# Patient Record
Sex: Male | Born: 1965
Health system: Southern US, Community
[De-identification: ages and names within clinical notes are randomized; demographics above are authoritative.]

## PROBLEM LIST (undated history)

## (undated) DIAGNOSIS — F419 Anxiety disorder, unspecified: Secondary | ICD-10-CM

## (undated) DIAGNOSIS — M549 Dorsalgia, unspecified: Secondary | ICD-10-CM

## (undated) DIAGNOSIS — K219 Gastro-esophageal reflux disease without esophagitis: Secondary | ICD-10-CM

## (undated) DIAGNOSIS — F329 Major depressive disorder, single episode, unspecified: Secondary | ICD-10-CM

## (undated) DIAGNOSIS — Z550 Illiteracy and low-level literacy: Secondary | ICD-10-CM

## (undated) DIAGNOSIS — F1910 Other psychoactive substance abuse, uncomplicated: Secondary | ICD-10-CM

## (undated) DIAGNOSIS — I509 Heart failure, unspecified: Secondary | ICD-10-CM

## (undated) DIAGNOSIS — F319 Bipolar disorder, unspecified: Secondary | ICD-10-CM

## (undated) DIAGNOSIS — I639 Cerebral infarction, unspecified: Secondary | ICD-10-CM

## (undated) DIAGNOSIS — F32A Depression, unspecified: Secondary | ICD-10-CM

## (undated) DIAGNOSIS — F988 Other specified behavioral and emotional disorders with onset usually occurring in childhood and adolescence: Secondary | ICD-10-CM

## (undated) DIAGNOSIS — Q249 Congenital malformation of heart, unspecified: Secondary | ICD-10-CM

## (undated) DIAGNOSIS — I10 Essential (primary) hypertension: Secondary | ICD-10-CM

## (undated) DIAGNOSIS — E785 Hyperlipidemia, unspecified: Secondary | ICD-10-CM

## (undated) HISTORY — DX: Other psychoactive substance abuse, uncomplicated: F19.10

## (undated) HISTORY — DX: Hyperlipidemia, unspecified: E78.5

## (undated) HISTORY — DX: Dorsalgia, unspecified: M54.9

## (undated) HISTORY — DX: Cerebral infarction, unspecified: I63.9

## (undated) HISTORY — DX: Illiteracy and low-level literacy: Z55.0

## (undated) HISTORY — DX: Bipolar disorder, unspecified: F31.9

## (undated) HISTORY — DX: Congenital malformation of heart, unspecified: Q24.9

## (undated) HISTORY — PX: CARDIAC CATHETERIZATION: SHX172

## (undated) HISTORY — PX: CARPAL TUNNEL RELEASE: SHX101

## (undated) HISTORY — DX: Major depressive disorder, single episode, unspecified: F32.9

## (undated) HISTORY — DX: Depression, unspecified: F32.A

## (undated) HISTORY — PX: SKIN GRAFT: SHX250

---

## 1994-07-05 HISTORY — PX: BACK SURGERY: SHX140

## 1998-06-15 ENCOUNTER — Emergency Department (HOSPITAL_COMMUNITY): Admission: EM | Admit: 1998-06-15 | Discharge: 1998-06-15 | Payer: Self-pay | Admitting: Emergency Medicine

## 2000-01-04 ENCOUNTER — Emergency Department (HOSPITAL_COMMUNITY): Admission: EM | Admit: 2000-01-04 | Discharge: 2000-01-04 | Payer: Self-pay

## 2000-03-16 ENCOUNTER — Encounter: Admission: RE | Admit: 2000-03-16 | Discharge: 2000-05-03 | Payer: Self-pay | Admitting: Occupational Medicine

## 2000-05-13 ENCOUNTER — Encounter: Admission: RE | Admit: 2000-05-13 | Discharge: 2000-06-13 | Payer: Self-pay | Admitting: Occupational Medicine

## 2005-04-23 ENCOUNTER — Emergency Department (HOSPITAL_COMMUNITY): Admission: EM | Admit: 2005-04-23 | Discharge: 2005-04-24 | Payer: Self-pay | Admitting: Emergency Medicine

## 2005-04-24 ENCOUNTER — Inpatient Hospital Stay (HOSPITAL_COMMUNITY): Admission: AD | Admit: 2005-04-24 | Discharge: 2005-04-26 | Payer: Self-pay | Admitting: Psychiatry

## 2005-04-24 ENCOUNTER — Ambulatory Visit: Payer: Self-pay | Admitting: Psychiatry

## 2006-03-11 ENCOUNTER — Emergency Department (HOSPITAL_COMMUNITY): Admission: EM | Admit: 2006-03-11 | Discharge: 2006-03-11 | Payer: Self-pay | Admitting: Emergency Medicine

## 2006-09-16 ENCOUNTER — Emergency Department (HOSPITAL_COMMUNITY): Admission: AC | Admit: 2006-09-16 | Discharge: 2006-09-16 | Payer: Self-pay

## 2007-04-18 ENCOUNTER — Inpatient Hospital Stay (HOSPITAL_COMMUNITY): Admission: AD | Admit: 2007-04-18 | Discharge: 2007-04-24 | Payer: Self-pay | Admitting: Psychiatry

## 2007-04-18 ENCOUNTER — Emergency Department (HOSPITAL_COMMUNITY): Admission: EM | Admit: 2007-04-18 | Discharge: 2007-04-18 | Payer: Self-pay | Admitting: Emergency Medicine

## 2007-04-18 ENCOUNTER — Ambulatory Visit: Payer: Self-pay | Admitting: Psychiatry

## 2009-11-27 ENCOUNTER — Emergency Department (HOSPITAL_COMMUNITY): Admission: EM | Admit: 2009-11-27 | Discharge: 2009-11-28 | Payer: Self-pay | Admitting: Emergency Medicine

## 2010-05-05 ENCOUNTER — Ambulatory Visit (HOSPITAL_BASED_OUTPATIENT_CLINIC_OR_DEPARTMENT_OTHER): Admission: RE | Admit: 2010-05-05 | Discharge: 2010-05-05 | Payer: Self-pay | Admitting: Orthopedic Surgery

## 2010-05-13 ENCOUNTER — Encounter: Admission: RE | Admit: 2010-05-13 | Discharge: 2010-05-13 | Payer: Self-pay | Admitting: Orthopedic Surgery

## 2010-08-19 ENCOUNTER — Encounter (HOSPITAL_BASED_OUTPATIENT_CLINIC_OR_DEPARTMENT_OTHER)
Admission: RE | Admit: 2010-08-19 | Discharge: 2010-08-19 | Disposition: A | Discharge status: Home / Self Care | Payer: BC Managed Care – PPO | Source: Ambulatory Visit | Attending: Orthopedic Surgery | Admitting: Orthopedic Surgery

## 2010-08-19 DIAGNOSIS — Z01810 Encounter for preprocedural cardiovascular examination: Secondary | ICD-10-CM | POA: Insufficient documentation

## 2010-08-21 ENCOUNTER — Ambulatory Visit (HOSPITAL_BASED_OUTPATIENT_CLINIC_OR_DEPARTMENT_OTHER)
Admission: RE | Admit: 2010-08-21 | Discharge: 2010-08-21 | Disposition: A | Discharge status: Home / Self Care | Payer: BC Managed Care – PPO | Source: Ambulatory Visit | Attending: Orthopedic Surgery | Admitting: Orthopedic Surgery

## 2010-08-21 DIAGNOSIS — Z01812 Encounter for preprocedural laboratory examination: Secondary | ICD-10-CM | POA: Insufficient documentation

## 2010-08-21 DIAGNOSIS — G5600 Carpal tunnel syndrome, unspecified upper limb: Secondary | ICD-10-CM | POA: Insufficient documentation

## 2010-08-21 LAB — POCT HEMOGLOBIN-HEMACUE: Hemoglobin: 15.7 g/dL (ref 13.0–17.0)

## 2010-08-24 NOTE — Op Note (Signed)
  [name], [name]               ACCOUNT NO.:  [id number]  MEDICAL RECORD NO.:  [id number]            PATIENT TYPE:  LOCATION:                                 FACILITY:  PHYSICIAN:  Cindee Salt, M.D.            DATE OF BIRTH:  DATE OF PROCEDURE:  08/21/2010 DATE OF DISCHARGE:                              OPERATIVE REPORT   PREOPERATIVE DIAGNOSIS:  Carpal tunnel syndrome, right hand.  POSTOPERATIVE DIAGNOSIS:  Carpal tunnel syndrome, right hand.  OPERATION:  Decompression, right median nerve.  SURGEON:  Cindee Salt, M.D.  ANESTHESIA:  General with local infiltration.  ANESTHESIOLOGIST:  Bedelia Person, M.D.  HISTORY:  The patient is a 45 year old male with a history of bilateral carpal tunnel syndrome, EMG nerve conduction is positive.  This has not responded to conservative treatment.  He has undergone carpal tunnel release on his left.  Is admitted now for carpal tunnel release on his right side.  He is aware of risks and complications including infection, recurrence of injury to arteries, nerves, tendons; incomplete relief of symptoms and dystrophy.  In the preoperative area, the patient is seen. The extremity marked by both patient and surgeon.  Antibiotic given.  PROCEDURE IN DETAIL:  The patient is brought to the operating room where a general anesthetic was carried out without difficulty.  He was prepped using ChloraPrep, supine position, right arm free.  A 3-minute dry time was allowed.  Time-out taken, confirming patient and procedure.  A longitudinal incision was made in the skin after exsanguination of the limb with an Esmarch bandage and inflation of tourniquet to 250 mmHg. Bleeders were electrocauterized.  The dissection was carried down, splitting in the palmar fascia.  The superficial palmar arch was identified.  Flexor tendon of the ring and little finger identified to the ulnar side of the median nerve.  Carpal retinaculum was incised with sharp dissection.   Right angle and Sewell retractors were placed between skin and forearm fascia.  The fascia released for approximately a centimeter and a half proximal to the wrist crease under direct vision. Canal was explored.  Area of compression to the nerve was immediately apparent.  No further lesions were identified.  The wound was irrigated. Skin closed with interrupted 5-0 Vicryl Rapide sutures.  Local infiltration with 0.25% Marcaine without epinephrine was given, approximately 7 mL was used.  Sterile compressive dressing, splint to the wrist with the fingers free was applied.  On deflation of the tourniquet, all fingers immediately pinked.  He was taken to the recovery room for observation in satisfactory condition.  He will be discharged home, to return to the [facility] [place] in 1 week, on __________ Benita Stabile.          ______________________________ Cindee Salt, M.D.     GK/MEDQ  D:  08/21/2010  T:  08/22/2010  Job:  [id number]  Electronically Signed by Cindee Salt M.D. on 08/24/2010 02:26:43 PM

## 2010-08-24 NOTE — Op Note (Signed)
[name], [name]               ACCOUNT NO.:  [id number]  MEDICAL RECORD NO.:  [id number]          PATIENT TYPE:  AMB  LOCATION:  DSC                          FACILITY:  [facility]  PHYSICIAN:  Cindee Salt, M.D.       DATE OF BIRTH:  Mar 17, 1966  DATE OF PROCEDURE:  05/05/2010 DATE OF DISCHARGE:                              OPERATIVE REPORT   PREOPERATIVE DIAGNOSIS:  Carpal tunnel syndrome, left hand.  POSTOPERATIVE DIAGNOSIS:  Carpal tunnel syndrome, left hand.  OPERATION:  Decompression of left median nerve.  SURGEON:  Cindee Salt, MD  ASSISTANT:  Betha Loa, MD  ANESTHESIA:  IV regional, local, general.  ANESTHESIOLOGIST:  Quita Skye. Krista Blue, MD  HISTORY:  The patient is a 45 year old male with a history of carpal tunnel syndrome bilaterally, EMG nerve conductions positive.  He has elected to undergo surgical release and that conservative treatment has not afforded him significant relief.  Pre, peri, and postoperative course have been discussed along with risks and complications.  He is aware that there is no guarantee with surgery, possibility of infection, recurrence of injury to arteries, nerves, and tendons, incomplete relief of symptoms, and dystrophy.  In the preoperative area, the patient is seen, the extremity is marked by both the patient and surgeon, and antibiotic is given.  PROCEDURE:  The patient was brought to the operating room where a forearm-based IV regional anesthetic was carried out without difficulty under the direction of Dr. Krista Blue.  He was prepped using ChloraPrep, supine position, left arm free.  A 3-minute dry time was allowed.  A time-out was taken conforming the patient and procedure.  A longitudinal incision was made in the palm.  He still had some feeling and general anesthetic was then given.  The dissection was carried down.  Palmar fascia was split.  Superficial palmar arch was identified.  The flexor tendon to the ring little finger was  identified to the ulnar side of the median nerve.  The carpal retinaculum was incised with sharp dissection. Right angle and Sewall retractor were placed between the skin and forearm fascia.  The fascia released for approximately a centimeter and half proximal to the wrist crease under direct vision.  Bleeders were electrocauterized with bipolar.  The nerve was then dissected from the overlying retinaculum.  The area of compression was immediately apparent.  No further lesions were identified.  The wound was irrigated. The skin was closed with interrupted 5-0 Vicryl Rapide.  A significant hyperemia was present to the nerve with a mild deformity, hourglass in nature at the level of the hamate hook.  A sterile compressive dressing and splint was applied.  The fingers were left free.  On deflation of the tourniquet, all fingers immediately pinked.  He was taken to the recovery room for observation in satisfactory condition.  He will be discharged home to return the [facility] [place] in 1 week on Percocet.          ______________________________ Cindee Salt, M.D.     GK/MEDQ  D:  05/05/2010  T:  05/06/2010  Job:  [id number]  Electronically Signed by Cindee Salt  M.D. on 08/24/2010 12:19:20 PM

## 2010-09-15 LAB — POCT HEMOGLOBIN-HEMACUE: Hemoglobin: 15.6 g/dL (ref 13.0–17.0)

## 2010-09-21 LAB — COMPREHENSIVE METABOLIC PANEL
ALT: 29 U/L (ref 0–53)
AST: 25 U/L (ref 0–37)
Albumin: 4 g/dL (ref 3.5–5.2)
CO2: 24 mEq/L (ref 19–32)
Calcium: 9.3 mg/dL (ref 8.4–10.5)
GFR calc Af Amer: >60 mL/min (ref >60)
GFR calc non Af Amer: >60 mL/min (ref >60)
Sodium: 139 mEq/L (ref 135–145)

## 2010-09-21 LAB — SALICYLATE LEVEL: Salicylate Lvl: <4 mg/dL (ref 2.8–20.0)

## 2010-09-21 LAB — DIFFERENTIAL
Eosinophils Absolute: 0.3 10*3/uL (ref 0.0–0.7)
Eosinophils Relative: 3 % (ref 0–5)
Lymphocytes Relative: 18 % (ref 12–46)
Lymphs Abs: 1.7 10*3/uL (ref 0.7–4.0)
Monocytes Absolute: 0.9 10*3/uL (ref 0.1–1.0)
Monocytes Relative: 9 % (ref 3–12)

## 2010-09-21 LAB — ACETAMINOPHEN LEVEL: Acetaminophen (Tylenol), Serum: <10 ug/mL — ABNORMAL LOW (ref 10–30)

## 2010-09-21 LAB — RAPID URINE DRUG SCREEN, HOSP PERFORMED
Amphetamines: NOT DETECTED
Barbiturates: NOT DETECTED
Benzodiazepines: POSITIVE — AB
Tetrahydrocannabinol: POSITIVE — AB

## 2010-09-21 LAB — CBC
MCHC: 33.8 g/dL (ref 30.0–36.0)
Platelets: 278 10*3/uL (ref 150–400)
RBC: 4.84 MIL/uL (ref 4.22–5.81)
WBC: 9.9 10*3/uL (ref 4.0–10.5)

## 2010-10-28 ENCOUNTER — Other Ambulatory Visit: Payer: Self-pay | Admitting: Occupational Medicine

## 2010-10-28 ENCOUNTER — Ambulatory Visit: Payer: Self-pay

## 2010-10-28 DIAGNOSIS — M549 Dorsalgia, unspecified: Secondary | ICD-10-CM
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
## 2011-04-15 LAB — CBC
HCT: 43.6
MCHC: 34.4
MCV: 87.7
RBC: 4.97

## 2011-04-15 LAB — URINALYSIS, ROUTINE W REFLEX MICROSCOPIC
Bilirubin Urine: NEGATIVE
Ketones, ur: NEGATIVE
Nitrite: NEGATIVE
pH: 8

## 2011-04-15 LAB — COMPREHENSIVE METABOLIC PANEL
AST: 21
BUN: 6
CO2: 28
Calcium: 9.2
Creatinine, Ser: 1
GFR calc Af Amer: >60
GFR calc non Af Amer: >60
Glucose, Bld: 99

## 2011-04-15 LAB — DIFFERENTIAL
Basophils Absolute: 0
Basophils Relative: 0
Eosinophils Relative: 0
Monocytes Absolute: 0.6
Neutro Abs: 4.8

## 2011-04-15 LAB — RAPID URINE DRUG SCREEN, HOSP PERFORMED
Amphetamines: NOT DETECTED
Cocaine: NOT DETECTED
Opiates: NOT DETECTED
Tetrahydrocannabinol: POSITIVE — AB

## 2013-01-08 ENCOUNTER — Encounter (INDEPENDENT_AMBULATORY_CARE_PROVIDER_SITE_OTHER): Payer: Self-pay | Admitting: Surgery

## 2013-01-09 ENCOUNTER — Encounter (INDEPENDENT_AMBULATORY_CARE_PROVIDER_SITE_OTHER): Payer: Self-pay | Admitting: Surgery

## 2013-01-09 ENCOUNTER — Ambulatory Visit (INDEPENDENT_AMBULATORY_CARE_PROVIDER_SITE_OTHER): Payer: Private Health Insurance - Indemnity | Admitting: Surgery

## 2013-01-09 VITALS — BP 112/64 | HR 80 | Temp 98.0°F | Resp 16 | Ht 66.0 in | Wt 199.4 lb

## 2013-01-09 DIAGNOSIS — K429 Umbilical hernia without obstruction or gangrene: Secondary | ICD-10-CM

## 2013-01-09 NOTE — Progress Notes (Signed)
Patient ID: [name], male   DOB: 1965/12/12, 47 y.o.   MRN: [id number]  Chief Complaint  Patient presents with  . New Evaluation    eval umb/ventral hernia    HPI [name] is a 47 y.o. male.   HPI This is a 47 year old gentleman referred by Altamease Oiler Redmon PA for asymptomatic umbilical hernia. He reports that he has had a hernia for some time but is now causing increasing discomfort. He has had some nausea with it. The pain is moderate in intensity and intermittent. He has had no vomiting or other obstructive symptoms. He is moving his bowels well.  Past Medical History  Diagnosis Date  . Depression   . Congenital heart disease   . Back pain   . History of ETOH abuse     Past Surgical History  Procedure Laterality Date  . Cardiac catheterization      at age 78  . Back surgery Bilateral 1996  . Skin graft    . Carpal tunnel release      Family History  Problem Relation Age of Onset  . Cancer Maternal Grandmother   . Cancer Paternal Grandfather     lymphoma    Social History History  Substance Use Topics  . Smoking status: Current Every Day Smoker -- 1.00 packs/day    Types: Cigarettes  . Smokeless tobacco: Never Used  . Alcohol Use: Yes     Comment: 12pack - case/week    Allergies  Allergen Reactions  . Codeine     Current Outpatient Prescriptions  Medication Sig Dispense Refill  . famotidine (PEPCID) 40 MG tablet Take 40 mg by mouth daily.      . traZODone (DESYREL) 50 MG tablet Take 50 mg by mouth at bedtime.       No current facility-administered medications for this visit.    Review of Systems Review of Systems  Constitutional: Negative for fever, chills and unexpected weight change.  HENT: Negative for hearing loss, congestion, sore throat, trouble swallowing and voice change.   Eyes: Negative for visual disturbance.  Respiratory: Negative for cough and wheezing.   Cardiovascular: Negative for chest pain, palpitations and leg swelling.   Gastrointestinal: Positive for nausea and abdominal pain. Negative for vomiting, diarrhea, constipation, blood in stool, abdominal distention, anal bleeding and rectal pain.  Genitourinary: Negative for hematuria and difficulty urinating.  Musculoskeletal: Positive for back pain. Negative for arthralgias.  Skin: Negative for rash and wound.  Neurological: Negative for seizures, syncope, weakness and headaches.  Hematological: Negative for adenopathy. Does not bruise/bleed easily.  Psychiatric/Behavioral: Negative for confusion.    Blood pressure 112/64, pulse 80, temperature 98 F (36.7 C), temperature source Temporal, resp. rate 16, height 5\' 6"  (1.676 m), weight 199 lb 6.4 oz (90.447 kg).  Physical Exam Physical Exam  Constitutional: He is oriented to person, place, and time. He appears well-developed and well-nourished. No distress.  HENT:  Head: Normocephalic and atraumatic.  Right Ear: External ear normal.  Left Ear: External ear normal.  Nose: Nose normal.  Mouth/Throat: Oropharynx is clear and moist.  Eyes: Conjunctivae and EOM are normal. Pupils are equal, round, and reactive to light. Right eye exhibits no discharge. Left eye exhibits no discharge. No scleral icterus.  Neck: Normal range of motion. Neck supple. No tracheal deviation present. No thyromegaly present.  Cardiovascular: Normal rate, regular rhythm, normal heart sounds and intact distal pulses.   No murmur heard. Pulmonary/Chest: Effort normal and breath sounds normal. No respiratory distress. He has  no wheezes.  Abdominal: Soft. Bowel sounds are normal. He exhibits no distension. There is no tenderness. There is no rebound.  He has a moderate size, reducible umbilical hernia. There is no evidence of inguinal hernia  Musculoskeletal: Normal range of motion. He exhibits no edema and no tenderness.  Lymphadenopathy:    He has no cervical adenopathy.  Neurological: He is alert and oriented to person, place, and time.   Skin: Skin is dry. No rash noted. He is not diaphoretic. No erythema.  Psychiatric: His behavior is normal.    Data Reviewed   Assessment    Symptomatic umbilical hernia     Plan    Repair of the hernia with mesh was recommended. I discussed this with him in detail. I discussed the risk of incarceration without repair. I discussed the risks of surgery which includes but is not limited to bleeding, infection, recurrence, et Karie Soda. He understands and wishes to proceed. I also discussed postop recovery. Surgery will be scheduled        Paulina Muchmore A 01/09/2013, 10:50 AM

## 2013-02-22 ENCOUNTER — Emergency Department (HOSPITAL_COMMUNITY)
Admission: EM | Admit: 2013-02-22 | Discharge: 2013-02-23 | Disposition: A | Discharge status: Home / Self Care | Payer: Managed Care, Other (non HMO) | Attending: Emergency Medicine | Admitting: Emergency Medicine

## 2013-02-22 ENCOUNTER — Encounter (HOSPITAL_COMMUNITY): Payer: Self-pay | Admitting: Emergency Medicine

## 2013-02-22 DIAGNOSIS — Z8774 Personal history of (corrected) congenital malformations of heart and circulatory system: Secondary | ICD-10-CM | POA: Insufficient documentation

## 2013-02-22 DIAGNOSIS — R443 Hallucinations, unspecified: Secondary | ICD-10-CM | POA: Insufficient documentation

## 2013-02-22 DIAGNOSIS — F17200 Nicotine dependence, unspecified, uncomplicated: Secondary | ICD-10-CM | POA: Insufficient documentation

## 2013-02-22 DIAGNOSIS — F1010 Alcohol abuse, uncomplicated: Secondary | ICD-10-CM

## 2013-02-22 DIAGNOSIS — F329 Major depressive disorder, single episode, unspecified: Secondary | ICD-10-CM | POA: Insufficient documentation

## 2013-02-22 DIAGNOSIS — Z951 Presence of aortocoronary bypass graft: Secondary | ICD-10-CM | POA: Insufficient documentation

## 2013-02-22 DIAGNOSIS — F3289 Other specified depressive episodes: Secondary | ICD-10-CM | POA: Insufficient documentation

## 2013-02-22 DIAGNOSIS — R45851 Suicidal ideations: Secondary | ICD-10-CM | POA: Insufficient documentation

## 2013-02-22 DIAGNOSIS — Z79899 Other long term (current) drug therapy: Secondary | ICD-10-CM | POA: Insufficient documentation

## 2013-02-22 LAB — CBC
Hemoglobin: 15 g/dL (ref 13.0–17.0)
MCHC: 33.6 g/dL (ref 30.0–36.0)
RDW: 13.9 % (ref 11.5–15.5)
WBC: 7.6 10*3/uL (ref 4.0–10.5)

## 2013-02-22 LAB — COMPREHENSIVE METABOLIC PANEL
ALT: 28 U/L (ref 0–53)
Albumin: 3.7 g/dL (ref 3.5–5.2)
Alkaline Phosphatase: 57 U/L (ref 39–117)
Potassium: 3.8 mEq/L (ref 3.5–5.1)
Sodium: 140 mEq/L (ref 135–145)
Total Protein: 7.2 g/dL (ref 6.0–8.3)

## 2013-02-22 LAB — RAPID URINE DRUG SCREEN, HOSP PERFORMED
Amphetamines: NOT DETECTED
Barbiturates: NOT DETECTED
Benzodiazepines: NOT DETECTED
Tetrahydrocannabinol: NOT DETECTED

## 2013-02-22 MED ORDER — ALUM & MAG HYDROXIDE-SIMETH 200-200-20 MG/5ML PO SUSP: 30.0000 mL | ORAL | Status: DC | PRN

## 2013-02-22 MED ORDER — FOLIC ACID 1 MG PO TABS
1.0000 mg | ORAL_TABLET | Freq: Every day | ORAL | Status: DC
  Administered 2013-02-22: 1 mg via ORAL
  Filled 2013-02-22: qty 1

## 2013-02-22 MED ORDER — ADULT MULTIVITAMIN W/MINERALS CH
1.0000 | ORAL_TABLET | Freq: Every day | ORAL | Status: DC
  Administered 2013-02-22: 1 via ORAL
  Filled 2013-02-22: qty 1

## 2013-02-22 MED ORDER — IBUPROFEN 200 MG PO TABS: 600.0000 mg | ORAL_TABLET | Freq: Three times a day (TID) | ORAL | Status: DC | PRN

## 2013-02-22 MED ORDER — LORAZEPAM 1 MG PO TABS: 1.0000 mg | ORAL_TABLET | Freq: Four times a day (QID) | ORAL | Status: DC | PRN

## 2013-02-22 MED ORDER — THIAMINE HCL 100 MG/ML IJ SOLN: 100.0000 mg | Freq: Every day | INTRAMUSCULAR | Status: DC

## 2013-02-22 MED ORDER — NICOTINE 21 MG/24HR TD PT24: 21.0000 mg | MEDICATED_PATCH | Freq: Every day | TRANSDERMAL | Status: DC

## 2013-02-22 MED ORDER — VITAMIN B-1 100 MG PO TABS
100.0000 mg | ORAL_TABLET | Freq: Every day | ORAL | Status: DC
  Administered 2013-02-22: 100 mg via ORAL
  Filled 2013-02-22: qty 1

## 2013-02-22 MED ORDER — ONDANSETRON HCL 4 MG PO TABS: 4.0000 mg | ORAL_TABLET | Freq: Three times a day (TID) | ORAL | Status: DC | PRN

## 2013-02-22 MED ORDER — LORAZEPAM 2 MG/ML IJ SOLN: 1.0000 mg | Freq: Four times a day (QID) | INTRAMUSCULAR | Status: DC | PRN

## 2013-02-22 MED ORDER — ZOLPIDEM TARTRATE 5 MG PO TABS: 5.0000 mg | ORAL_TABLET | Freq: Every evening | ORAL | Status: DC | PRN

## 2013-02-22 MED ORDER — LORAZEPAM 1 MG PO TABS: 0.0000 mg | ORAL_TABLET | Freq: Four times a day (QID) | ORAL | Status: DC

## 2013-02-22 MED ORDER — LORAZEPAM 1 MG PO TABS: 0.0000 mg | ORAL_TABLET | Freq: Two times a day (BID) | ORAL | Status: DC
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
## 2013-02-22 NOTE — ED Notes (Addendum)
Pt presenting to ed with c/o "I need detox from  alcohol pt states he had a plan to kill himself up until his buddy came and got him today and brought him to the ER" pt denies HI

## 2013-02-22 NOTE — ED Provider Notes (Signed)
CSN: [id number]     Arrival date & time 02/22/13  1532 History    This chart was scribed for Magnus Sinning, PA, working with Gerhard Munch, MD by Blanchard Kelch, ED Scribe. This patient was seen in room [facility] and the patient's care was started at 5:31 PM.     Chief Complaint  Patient presents with  . Medical Clearance    Patient is a 47 y.o. male presenting with alcohol problem.  Alcohol Problem This is a chronic problem. The current episode started more than 1 week ago. The problem occurs constantly. Pertinent negatives include no chest pain, no abdominal pain, no headaches and no shortness of breath. Nothing aggravates the symptoms. Nothing relieves the symptoms.    HPI Comments: [name] is a 47 y.o. male who presents to the Emergency Department needing alcohol detox. Patient drinks 24 cans and a pint of liquor on average per day. Patient states his last drink was right before coming (drinking in parking lot) to ED, about two hours ago. . Patient complains of wanting to drink currently. Patient reports suicidal ideations but states he cannot commit suicide because sibling committed suicide 19 years ago and it would devastate his parents. Patient denies homicidal ideations.  Patient was previously admitted at [facility] and completed a 14 day alcohol detox in the past. Patient was sober for a year after completing detox the last time. Patient is a current every day smoker (1 pack).   Past Medical History  Diagnosis Date  . Depression   . Congenital heart disease   . Back pain   . History of ETOH abuse    Past Surgical History  Procedure Laterality Date  . Cardiac catheterization      at age 73  . Back surgery Bilateral 1996  . Skin graft    . Carpal tunnel release     Family History  Problem Relation Age of Onset  . Cancer Maternal Grandmother   . Cancer Paternal Grandfather     lymphoma   History  Substance Use Topics  . Smoking status:  Current Every Day Smoker -- 1.00 packs/day    Types: Cigarettes  . Smokeless tobacco: Never Used  . Alcohol Use: Yes     Comment: 24 beers daily and 1 pint of liquor daily    Review of Systems  Respiratory: Negative for shortness of breath.   Cardiovascular: Negative for chest pain.  Gastrointestinal: Negative for abdominal pain.  Neurological: Negative for headaches.  Psychiatric/Behavioral: Positive for suicidal ideas and hallucinations. Negative for self-injury.       Denies HI  All other systems reviewed and are negative.    Allergies  Codeine  Home Medications   Current Outpatient Rx  Name  Route  Sig  Dispense  Refill  . diazepam (VALIUM) 10 MG tablet   Oral   Take 10 mg by mouth every 12 (twelve) hours as needed for anxiety.         . divalproex (DEPAKOTE ER) 500 MG 24 hr tablet   Oral   Take 500 mg by mouth 2 (two) times daily.         . famotidine (PEPCID) 40 MG tablet   Oral   Take 40 mg by mouth daily.         . QUEtiapine (SEROQUEL) 50 MG tablet   Oral   Take 50 mg by mouth 2 (two) times daily.         . traZODone (DESYREL)  50 MG tablet   Oral   Take 50-150 mg by mouth at bedtime.           Triage Vitals: BP 131/83  Pulse 103  Temp(Src) 98.4 F (36.9 C) (Oral)  Resp 20  SpO2 99%  Physical Exam  Nursing note and vitals reviewed. Constitutional: He appears well-developed and well-nourished. No distress.  HENT:  Head: Normocephalic and atraumatic.  Neck: Normal range of motion. Neck supple.  Cardiovascular: Normal rate, regular rhythm and normal heart sounds.   Pulmonary/Chest: Effort normal and breath sounds normal. No respiratory distress.  Musculoskeletal: Normal range of motion.  Neurological: He is alert.  Skin: Skin is warm and dry. He is not diaphoretic.  Psychiatric: He has a normal mood and affect. He expresses suicidal ideation. He expresses no homicidal ideation.    ED Course  DIAGNOSTIC STUDIES: Oxygen Saturation is  99% on room air, normal by my interpretation.    COORDINATION OF CARE:  5:35 PM - Ordered CBC, CMP, Ethanol screen, Urine drug screen. Will have bedside sitter with patient. Discussed plan for ongoing treatment and psychiatric evaluation with patient. Patient verbalizes understanding and agrees with treatment plan.   Medications  LORazepam (ATIVAN) tablet 1 mg (not administered)    Or  LORazepam (ATIVAN) injection 1 mg (not administered)  thiamine (VITAMIN B-1) tablet 100 mg (not administered)    Or  thiamine (B-1) injection 100 mg (not administered)  folic acid (FOLVITE) tablet 1 mg (not administered)  multivitamin with minerals tablet 1 tablet (not administered)  LORazepam (ATIVAN) tablet 0-4 mg (not administered)    Followed by  LORazepam (ATIVAN) tablet 0-4 mg (not administered)  ibuprofen (ADVIL,MOTRIN) tablet 600 mg (not administered)  zolpidem (AMBIEN) tablet 5 mg (not administered)  ondansetron (ZOFRAN) tablet 4 mg (not administered)  nicotine (NICODERM CQ - dosed in mg/24 hours) patch 21 mg (not administered)  alum & mag hydroxide-simeth (MAALOX/MYLANTA) 200-200-20 MG/5ML suspension 30 mL (not administered)     Procedures (including critical care time)  Labs Reviewed  CBC  URINE RAPID DRUG SCREEN (HOSP PERFORMED)  COMPREHENSIVE METABOLIC PANEL  ETHANOL   No results found. No diagnosis found.  5:50 PM Discussed with Tresa Endo with ACT team.  MDM  Patient presents today requesting alcohol detox.  He also reports vague suicidal thoughts, but no plan.  No signs of DT's at this time.  CIWA orders have been placed.  Psych holding orders have also been placed.  ACT team has been notified of the patient.  I personally performed the services described in this documentation, which was scribed in my presence. The recorded information has been reviewed and is accurate.   Pascal Lux [place], PA-C 02/22/13 1904

## 2013-02-23 DIAGNOSIS — F1010 Alcohol abuse, uncomplicated: Secondary | ICD-10-CM

## 2013-02-23 NOTE — BH Assessment (Signed)
Donell Sievert, PA accepted to [facility] pending bed availability. Contacted:  [facility]: Per Thayer Ohm, beds available. Faxed clinical information. [facility]: Per Fleet Contras, no beds available.  Harlin Rain Ria Comment, [facility] Triage Specialist

## 2013-02-23 NOTE — ED Provider Notes (Signed)
  Medical screening examination/treatment/procedure(s) were performed by non-physician practitioner and as supervising physician I was immediately available for consultation/collaboration.    Gerhard Munch, MD 02/23/13 [phone]

## 2013-02-23 NOTE — Consult Note (Signed)
[facility] Psychiatry Consult   Reason for Consult:  Evaluation for Alcohol detoxification Referring Physician:  Jeraldine Loots MD  [name] is an 47 y.o. male.  Assessment: AXIS I:  Alcohol Abuse AXIS II:  No diagnosis AXIS III:   Past Medical History  Diagnosis Date  . Depression   . Congenital heart disease   . Back pain   . History of ETOH abuse    AXIS IV:  other psychosocial or environmental problems AXIS V:  21-30 behavior considerably influenced by delusions or hallucinations OR serious impairment in judgment, communication OR inability to function in almost all areas  Plan:  Recommend psychiatric Inpatient admission when medically cleared.  Subjective:   [name] is a 47 y.o. male patient presenting voluntarily seeking alcohol detox. Patient consumed 8-9 beers today prior to presenting to the ED. Patient generally consumes 24 beers and a pint of liquor daily. Patient also endorses a history of DT's but denies siezure disorders. Patient has a hx of depression and is followed by Dr Evelene Croon MD. Patient denies any SI/SA/HI or AVH at this present time. HPI:   HPI Elements:     Past Psychiatric History: Past Medical History  Diagnosis Date  . Depression   . Congenital heart disease   . Back pain   . History of ETOH abuse     reports that he has been smoking Cigarettes.  He has been smoking about 1.00 pack per day. He has never used smokeless tobacco. He reports that  drinks alcohol. He reports that he does not use illicit drugs. Family History  Problem Relation Age of Onset  . Cancer Maternal Grandmother   . Cancer Paternal Grandfather     lymphoma   Family History Substance Abuse: Yes, Describe: Engineer, civil (consulting)) Family Supports: Yes, List: (Wife) Living Arrangements: Spouse/significant other Can pt return to current living arrangement?: Yes   Allergies:   Allergies  Allergen Reactions  . Codeine Nausea And Vomiting    Past Psychiatric History: Diagnosis: MDD,  Alcohol abuse  Hospitalizations:  Previous at [facility]  Outpatient Care:  Dr Evelene Croon MD  Substance Abuse Care:  yes  Self-Mutilation:  no  Suicidal Attempts:  no  Violent Behaviors:  no   Objective: Blood pressure 105/66, pulse 86, temperature 98 F (36.7 C), temperature source Oral, resp. rate 20, SpO2 95.00%.There is no weight on file to calculate BMI. Results for orders placed during the hospital encounter of 02/22/13 (from the past 72 hour(s))  URINE RAPID DRUG SCREEN (HOSP PERFORMED)     Status: None   Collection Time    02/22/13  4:41 PM      Result Value Range   Opiates NONE DETECTED  NONE DETECTED   Cocaine NONE DETECTED  NONE DETECTED   Benzodiazepines NONE DETECTED  NONE DETECTED   Amphetamines NONE DETECTED  NONE DETECTED   Tetrahydrocannabinol NONE DETECTED  NONE DETECTED   Barbiturates NONE DETECTED  NONE DETECTED   Comment:            DRUG SCREEN FOR MEDICAL PURPOSES     ONLY.  IF CONFIRMATION IS NEEDED     FOR ANY PURPOSE, NOTIFY LAB     WITHIN 5 DAYS.                LOWEST DETECTABLE LIMITS     FOR URINE DRUG SCREEN     Drug Class       Cutoff (ng/mL)     Amphetamine      1000  Barbiturate      200     Benzodiazepine   200     Tricyclics       300     Opiates          300     Cocaine          300     THC              50  CBC     Status: None   Collection Time    02/22/13  5:20 PM      Result Value Range   WBC 7.6  4.0 - 10.5 K/uL   RBC 4.97  4.22 - 5.81 MIL/uL   Hemoglobin 15.0  13.0 - 17.0 g/dL   HCT 16.1  09.6 - 04.5 %   MCV 89.9  78.0 - 100.0 fL   MCH 30.2  26.0 - 34.0 pg   MCHC 33.6  30.0 - 36.0 g/dL   RDW 40.9  81.1 - 91.4 %   Platelets 271  150 - 400 K/uL  COMPREHENSIVE METABOLIC PANEL     Status: Abnormal   Collection Time    02/22/13  5:20 PM      Result Value Range   Sodium 140  135 - 145 mEq/L   Potassium 3.8  3.5 - 5.1 mEq/L   Chloride 105  96 - 112 mEq/L   CO2 24  19 - 32 mEq/L   Glucose, Bld 84  70 - 99 mg/dL   BUN 14  6 - 23 mg/dL    Creatinine, Ser 7.82  0.50 - 1.35 mg/dL   Calcium 8.9  8.4 - 95.6 mg/dL   Total Protein 7.2  6.0 - 8.3 g/dL   Albumin 3.7  3.5 - 5.2 g/dL   AST 20  0 - 37 U/L   ALT 28  0 - 53 U/L   Alkaline Phosphatase 57  39 - 117 U/L   Total Bilirubin 0.3  0.3 - 1.2 mg/dL   GFR calc non Af Amer 76 (*) >90 mL/min   GFR calc Af Amer 88 (*) >90 mL/min   Comment: (NOTE)     The eGFR has been calculated using the CKD EPI equation.     This calculation has not been validated in all clinical situations.     eGFR's persistently <90 mL/min signify possible Chronic Kidney     Disease.  ETHANOL     Status: Abnormal   Collection Time    02/22/13  5:20 PM      Result Value Range   Alcohol, Ethyl (B) 175 (*) 0 - 11 mg/dL   Comment:            LOWEST DETECTABLE LIMIT FOR     SERUM ALCOHOL IS 11 mg/dL     FOR MEDICAL PURPOSES ONLY   Labs are reviewed and are pertinent for BAL elevated at 175  Current Facility-Administered Medications  Medication Dose Route Frequency Provider Last Rate Last Dose  . alum & mag hydroxide-simeth (MAALOX/MYLANTA) 200-200-20 MG/5ML suspension 30 mL  30 mL Oral PRN Pascal Lux Wingen, PA-C      . folic acid (FOLVITE) tablet 1 mg  1 mg Oral Daily Pascal Lux Wingen, PA-C   1 mg at 02/22/13 1806  . ibuprofen (ADVIL,MOTRIN) tablet 600 mg  600 mg Oral Q8H PRN Pascal Lux Wingen, PA-C      . LORazepam (ATIVAN) tablet 1 mg  1 mg Oral Q6H PRN Pascal Lux  Wingen, PA-C       Or  . LORazepam (ATIVAN) injection 1 mg  1 mg Intravenous Q6H PRN Pascal Lux Wingen, PA-C      . LORazepam (ATIVAN) tablet 0-4 mg  0-4 mg Oral Q6H Heather Van Wingen, PA-C       Followed by  . [START ON 02/24/2013] LORazepam (ATIVAN) tablet 0-4 mg  0-4 mg Oral Q12H Heather Van Wingen, PA-C      . multivitamin with minerals tablet 1 tablet  1 tablet Oral Daily Magnus Sinning, PA-C   1 tablet at 02/22/13 1806  . nicotine (NICODERM CQ - dosed in mg/24 hours) patch 21 mg  21 mg Transdermal Daily Heather Van Wingen,  PA-C      . ondansetron [facility]) tablet 4 mg  4 mg Oral Q8H PRN Pascal Lux Wingen, PA-C      . thiamine (VITAMIN B-1) tablet 100 mg  100 mg Oral Daily Pascal Lux Wingen, PA-C   100 mg at 02/22/13 1806   Or  . thiamine (B-1) injection 100 mg  100 mg Intravenous Daily Heather Van Wingen, PA-C      . zolpidem (AMBIEN) tablet 5 mg  5 mg Oral QHS PRN Magnus Sinning, PA-C       Current Outpatient Prescriptions  Medication Sig Dispense Refill  . diazepam (VALIUM) 10 MG tablet Take 10 mg by mouth every 12 (twelve) hours as needed for anxiety.      . divalproex (DEPAKOTE ER) 500 MG 24 hr tablet Take 500 mg by mouth 2 (two) times daily.      . famotidine (PEPCID) 40 MG tablet Take 40 mg by mouth daily.      . QUEtiapine (SEROQUEL) 50 MG tablet Take 50 mg by mouth 2 (two) times daily.      . traZODone (DESYREL) 50 MG tablet Take 50-150 mg by mouth at bedtime.         Psychiatric Specialty Exam:     Blood pressure 105/66, pulse 86, temperature 98 F (36.7 C), temperature source Oral, resp. rate 20, SpO2 95.00%.There is no weight on file to calculate BMI.  General Appearance: Disheveled  Eye Contact::  Good  Speech:  Clear and Coherent  Volume:  Normal  Mood:  Worthless  Affect:  Congruent  Thought Process:  Circumstantial  Orientation:  Full (Time, Place, and Person)  Thought Content:  Negative  Suicidal Thoughts:  No  Homicidal Thoughts:  No  Memory:  Immediate;   Good  Judgement:  Fair  Insight:  Fair  Psychomotor Activity:  Negative  Concentration:  Good  Recall:  Good  Akathisia:  Negative  Handed:  Right  AIMS (if indicated):     Assets:  Desire for Improvement  Sleep:      Treatment Plan Summary: 1) Patient meeting IP criteria for crises mgmt, safety and stabilization for Alcohol detox pending bed at [facility] 300 hall 2) Social work to aid in OP support services( AA )and f/u with Dr Evelene Croon upon d/c from [facility] 3) Administration of psychotropics and psychotherapeutic  interventions 4) Mgmt of applicable co-morbid condtions  SIMON,SPENCER E 02/23/2013 3:06 AM  I agreed with the findings, treatment and disposition plan of this patient. Kathryne Sharper, MD

## 2013-02-23 NOTE — B[facility] Counselor (Signed)
Writer consulted with the Dr. Taylor regarding the patient not meeting criteria for inpatient hospitalization.  Writer discussed the recommendations of Dr. Taylor with the ER MD.      Writer met with the patient and he continues to deny SI/HI and psychosis. Writer provided the patient with outpatient referrals.  

## 2013-02-23 NOTE — ED Provider Notes (Signed)
T/C from ACT team: Psych Dr. Ladona Ridgel has evaluated pt and requests EDP to d/c pt, as he no longer wants to go to detox. ACT team to give pt outpatient detox resources for pt to call. Pt d/c.    Laray Anger, DO 02/23/13 1057

## 2013-02-23 NOTE — Progress Notes (Signed)
Mr Beeney says he knows he needs help with his drinking.  He does not want to go to detox and will follow up with his outpatient psychiatrist and AA.  He presents no danger to himself or others , recommend discharge home today.

## 2013-07-24 ENCOUNTER — Ambulatory Visit: Payer: Self-pay

## 2014-06-11 ENCOUNTER — Encounter: Payer: Self-pay | Admitting: Medical

## 2014-06-11 ENCOUNTER — Ambulatory Visit (INDEPENDENT_AMBULATORY_CARE_PROVIDER_SITE_OTHER): Payer: Managed Care, Other (non HMO) | Admitting: Medical

## 2014-06-11 VITALS — BP 122/80 | HR 88 | Temp 97.7°F | Resp 16 | Ht 66.0 in | Wt 229.0 lb

## 2014-06-11 DIAGNOSIS — K645 Perianal venous thrombosis: Secondary | ICD-10-CM

## 2014-06-11 MED ORDER — HYDROCORTISONE 2.5 % RE CREA: 1.0000 "application " | TOPICAL_CREAM | Freq: Two times a day (BID) | RECTAL | Status: DC

## 2014-06-11 MED ORDER — DOCUSATE SODIUM 100 MG PO CAPS: 100.0000 mg | ORAL_CAPSULE | Freq: Two times a day (BID) | ORAL | Status: DC

## 2014-06-11 MED ORDER — HYDROCORTISONE ACETATE 25 MG RE SUPP: 25.0000 mg | Freq: Two times a day (BID) | RECTAL | Status: DC

## 2014-06-11 NOTE — Progress Notes (Signed)
Subjective: Here as a new patient.  He is here for blood in the stool.  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  Last week he was lifting something heavy and later felt a bulging mass at his anus. Since then he has seen some bright red blood on the told paper.  Other than that he occasionally has seen blood in the stool but not regularly, none in quite some time. He does get hemorrhoids from time to time and doing heavy lifting. No prior surgery for hemorrhoids. No prior colonoscopy.  Both his father and wife advised him that he probably has a hemorrhoid given his complaints and he is walking funny.  ROS as in subjective  Objective: Gen.: Well-developed, obese white male no acute distress Abdomen: Positive bowel sounds, soft, nontender, no mass or organomegaly To the right of the anus is a 2 cm thrombosed hemorrhoid that is mildly tender otherwise no other fissure or abnormality at the anus, the hemorrhoid is seeping a little blood   Assessment: Encounter Diagnosis  Name Primary?  . Thrombosed external hemorrhoid Yes    Plan: We discussed his findings, and the fact that this may not resolve without procedure to remove the clotted material.  Nevertheless, he wants to try another approach. Thus, will try SITZ baths, suppository and cream, increase water and fiber intake avoid straining.    Specific recommendations today include:  Tell your wife that she is correct  For the next 7 days, use SITZ baths - hot bath soaks with epsom salts and soapy water to soak your rectum for 20+ minutes daily  Avoid straining and heavy lifting  Begin the suppositories and the topical cream I sent to your pharmacy  If needed, use the Colace stools softener oral capsules  Make sure you are getting 64 oz of water daily, 25 g of fiber daily in your diet  If the hemorrhoid gets bigger or more painful, come back right away   F/u 1wk

## 2014-06-11 NOTE — Patient Instructions (Signed)
Thank you for giving me the opportunity to serve you today.    Your diagnosis today includes: Encounter Diagnosis  Name Primary?  . Thrombosed external hemorrhoid Yes    Specific recommendations today include:  Tell your wife that she is correct  For the next 7 days, use SITZ baths - hot bath soaks with epsom salts and soapy water to soak your rectum for 20+ minutes daily  Avoid straining and heavy lifting  Begin the suppositories and the topical cream I sent to your pharmacy  If needed, use the Colace stools softener oral capsules  Make sure you are getting 64 oz of water daily, 25 g of fiber daily in your diet  If the hemorrhoid gets bigger or more painful, come back right away    I have included other useful information below for your review.  Hemorrhoids Hemorrhoids are swollen veins around the rectum or anus. There are two types of hemorrhoids:   Internal hemorrhoids. These occur in the veins just inside the rectum. They may poke through to the outside and become irritated and painful.  External hemorrhoids. These occur in the veins outside the anus and can be felt as a painful swelling or hard lump near the anus. CAUSES  Pregnancy.   Obesity.   Constipation or diarrhea.   Straining to have a bowel movement.   Sitting for long periods on the toilet.  Heavy lifting or other activity that caused you to strain.  Anal intercourse. SYMPTOMS   Pain.   Anal itching or irritation.   Rectal bleeding.   Fecal leakage.   Anal swelling.   One or more lumps around the anus.  DIAGNOSIS  Your caregiver may be able to diagnose hemorrhoids by visual examination. Other examinations or tests that may be performed include:   Examination of the rectal area with a gloved hand (digital rectal exam).   Examination of anal canal using a small tube (scope).   A blood test if you have lost a significant amount of blood.  A test to look inside the colon  (sigmoidoscopy or colonoscopy). TREATMENT Most hemorrhoids can be treated at home. However, if symptoms do not seem to be getting better or if you have a lot of rectal bleeding, your caregiver may perform a procedure to help make the hemorrhoids get smaller or remove them completely. Possible treatments include:   Placing a rubber band at the base of the hemorrhoid to cut off the circulation (rubber band ligation).   Injecting a chemical to shrink the hemorrhoid (sclerotherapy).   Using a tool to burn the hemorrhoid (infrared light therapy).   Surgically removing the hemorrhoid (hemorrhoidectomy).   Stapling the hemorrhoid to block blood flow to the tissue (hemorrhoid stapling).  HOME CARE INSTRUCTIONS   Eat foods with fiber, such as whole grains, beans, nuts, fruits, and vegetables. Ask your doctor about taking products with added fiber in them (fibersupplements).  Increase fluid intake. Drink enough water and fluids to keep your urine clear or pale yellow.   Exercise regularly.   Go to the bathroom when you have the urge to have a bowel movement. Do not wait.   Avoid straining to have bowel movements.   Keep the anal area dry and clean. Use wet toilet paper or moist towelettes after a bowel movement.   Medicated creams and suppositories may be used or applied as directed.   Only take over-the-counter or prescription medicines as directed by your caregiver.   Take warm sitz baths  for 15-20 minutes, 3-4 times a day to ease pain and discomfort.   Place ice packs on the hemorrhoids if they are tender and swollen. Using ice packs between sitz baths may be helpful.   Put ice in a plastic bag.   Place a towel between your skin and the bag.   Leave the ice on for 15-20 minutes, 3-4 times a day.   Do not use a donut-shaped pillow or sit on the toilet for long periods. This increases blood pooling and pain.  SEEK MEDICAL CARE IF:  You have increasing pain and  swelling that is not controlled by treatment or medicine.  You have uncontrolled bleeding.  You have difficulty or you are unable to have a bowel movement.  You have pain or inflammation outside the area of the hemorrhoids. MAKE SURE YOU:  Understand these instructions.  Will watch your condition.  Will get help right away if you are not doing well or get worse. Document Released: 06/18/2000 Document Revised: 06/07/2012 Document Reviewed: 04/25/2012 [facility] Patient Information 2015 [place], [place]. This information is not intended to replace advice given to you by your health care provider. Make sure you discuss any questions you have with your health care provider.

## 2014-10-29 ENCOUNTER — Emergency Department (HOSPITAL_COMMUNITY): Payer: Managed Care, Other (non HMO)

## 2014-10-29 ENCOUNTER — Encounter (HOSPITAL_COMMUNITY): Payer: Self-pay

## 2014-10-29 ENCOUNTER — Inpatient Hospital Stay (HOSPITAL_COMMUNITY)
Admission: EM | Admit: 2014-10-29 | Discharge: 2014-11-01 | DRG: 066 | Disposition: A | Discharge status: Home Health | Payer: Managed Care, Other (non HMO) | Attending: Family Medicine | Admitting: Family Medicine

## 2014-10-29 DIAGNOSIS — R079 Chest pain, unspecified: Secondary | ICD-10-CM

## 2014-10-29 DIAGNOSIS — E785 Hyperlipidemia, unspecified: Secondary | ICD-10-CM

## 2014-10-29 DIAGNOSIS — F17210 Nicotine dependence, cigarettes, uncomplicated: Secondary | ICD-10-CM | POA: Diagnosis present

## 2014-10-29 DIAGNOSIS — F17200 Nicotine dependence, unspecified, uncomplicated: Secondary | ICD-10-CM

## 2014-10-29 DIAGNOSIS — G4733 Obstructive sleep apnea (adult) (pediatric): Secondary | ICD-10-CM | POA: Diagnosis present

## 2014-10-29 DIAGNOSIS — F329 Major depressive disorder, single episode, unspecified: Secondary | ICD-10-CM | POA: Diagnosis present

## 2014-10-29 DIAGNOSIS — R479 Unspecified speech disturbances: Secondary | ICD-10-CM

## 2014-10-29 DIAGNOSIS — Z6837 Body mass index (BMI) 37.0-37.9, adult: Secondary | ICD-10-CM

## 2014-10-29 DIAGNOSIS — F419 Anxiety disorder, unspecified: Secondary | ICD-10-CM | POA: Diagnosis present

## 2014-10-29 DIAGNOSIS — I63412 Cerebral infarction due to embolism of left middle cerebral artery: Secondary | ICD-10-CM | POA: Diagnosis not present

## 2014-10-29 DIAGNOSIS — I639 Cerebral infarction, unspecified: Secondary | ICD-10-CM | POA: Diagnosis not present

## 2014-10-29 DIAGNOSIS — I10 Essential (primary) hypertension: Secondary | ICD-10-CM

## 2014-10-29 DIAGNOSIS — F1210 Cannabis abuse, uncomplicated: Secondary | ICD-10-CM

## 2014-10-29 DIAGNOSIS — K219 Gastro-esophageal reflux disease without esophagitis: Secondary | ICD-10-CM | POA: Diagnosis present

## 2014-10-29 DIAGNOSIS — F1010 Alcohol abuse, uncomplicated: Secondary | ICD-10-CM | POA: Diagnosis present

## 2014-10-29 DIAGNOSIS — M545 Low back pain: Secondary | ICD-10-CM | POA: Diagnosis present

## 2014-10-29 HISTORY — DX: Heart failure, unspecified: I50.9

## 2014-10-29 HISTORY — DX: Essential (primary) hypertension: I10

## 2014-10-29 LAB — COMPREHENSIVE METABOLIC PANEL
ALT: 36 U/L (ref 0–53)
AST: 20 U/L (ref 0–37)
Albumin: 3.6 g/dL (ref 3.5–5.2)
Alkaline Phosphatase: 47 U/L (ref 39–117)
Anion gap: 10 (ref 5–15)
BILIRUBIN TOTAL: 0.5 mg/dL (ref 0.3–1.2)
BUN: 10 mg/dL (ref 6–23)
CO2: 23 mmol/L (ref 19–32)
CREATININE: 1.02 mg/dL (ref 0.50–1.35)
Calcium: 8.8 mg/dL (ref 8.4–10.5)
Chloride: 104 mmol/L (ref 96–112)
GFR calc Af Amer: >90 mL/min (ref >90)
GFR calc non Af Amer: 84 mL/min — ABNORMAL LOW (ref >90)
GLUCOSE: 81 mg/dL (ref 70–99)
Potassium: 3.9 mmol/L (ref 3.5–5.1)
Sodium: 137 mmol/L (ref 135–145)
Total Protein: 6.3 g/dL (ref 6.0–8.3)

## 2014-10-29 LAB — I-STAT TROPONIN, ED
TROPONIN I, POC: 0.01 ng/mL (ref 0.00–0.08)
Troponin i, poc: 0 ng/mL (ref 0.00–0.08)

## 2014-10-29 LAB — CBC WITH DIFFERENTIAL/PLATELET
BASOS ABS: 0 10*3/uL (ref 0.0–0.1)
BASOS PCT: 0 % (ref 0–1)
Eosinophils Absolute: 0.2 10*3/uL (ref 0.0–0.7)
Eosinophils Relative: 2 % (ref 0–5)
HCT: 39.7 % (ref 39.0–52.0)
Hemoglobin: 13.2 g/dL (ref 13.0–17.0)
LYMPHS ABS: 2.2 10*3/uL (ref 0.7–4.0)
Lymphocytes Relative: 24 % (ref 12–46)
MCH: 29.7 pg (ref 26.0–34.0)
MCHC: 33.2 g/dL (ref 30.0–36.0)
MCV: 89.4 fL (ref 78.0–100.0)
MONOS PCT: 13 % — AB (ref 3–12)
Monocytes Absolute: 1.2 10*3/uL — ABNORMAL HIGH (ref 0.1–1.0)
NEUTROS PCT: 61 % (ref 43–77)
Neutro Abs: 5.6 10*3/uL (ref 1.7–7.7)
Platelets: 236 10*3/uL (ref 150–400)
RBC: 4.44 MIL/uL (ref 4.22–5.81)
RDW: 13.7 % (ref 11.5–15.5)
WBC: 9.3 10*3/uL (ref 4.0–10.5)

## 2014-10-29 LAB — VALPROIC ACID LEVEL: Valproic Acid Lvl: <10 ug/mL — ABNORMAL LOW (ref 50.0–100.0)

## 2014-10-29 LAB — CBG MONITORING, ED: GLUCOSE-CAPILLARY: 125 mg/dL — AB (ref 70–99)

## 2014-10-29 MED ORDER — DIVALPROEX SODIUM ER 500 MG PO TB24
1000.0000 mg | ORAL_TABLET | Freq: Two times a day (BID) | ORAL | Status: DC
  Administered 2014-10-30 – 2014-11-01 (×6): 1000 mg via ORAL
  Filled 2014-10-29 (×9): qty 2

## 2014-10-29 MED ORDER — ASPIRIN 300 MG RE SUPP: 300.0000 mg | Freq: Every day | RECTAL | Status: DC

## 2014-10-29 MED ORDER — LORAZEPAM 2 MG/ML IJ SOLN
1.0000 mg | Freq: Once | INTRAMUSCULAR | Status: DC
  Filled 2014-10-29: qty 1

## 2014-10-29 MED ORDER — BENZTROPINE MESYLATE 0.5 MG PO TABS
1.0000 mg | ORAL_TABLET | Freq: Two times a day (BID) | ORAL | Status: DC
  Administered 2014-10-30 – 2014-10-31 (×5): 1 mg via ORAL
  Filled 2014-10-29 (×5): qty 2

## 2014-10-29 MED ORDER — DIAZEPAM 5 MG PO TABS
5.0000 mg | ORAL_TABLET | Freq: Two times a day (BID) | ORAL | Status: DC | PRN
  Administered 2014-10-30: 5 mg via ORAL
  Filled 2014-10-29: qty 1

## 2014-10-29 MED ORDER — LORAZEPAM 2 MG/ML IJ SOLN
1.0000 mg | Freq: Once | INTRAMUSCULAR | Status: AC
  Administered 2014-10-29: 1 mg via INTRAVENOUS
  Filled 2014-10-29: qty 1

## 2014-10-29 MED ORDER — ASPIRIN 325 MG PO TABS
325.0000 mg | ORAL_TABLET | Freq: Once | ORAL | Status: AC
  Administered 2014-10-29: 325 mg via ORAL
  Filled 2014-10-29: qty 1

## 2014-10-29 MED ORDER — STROKE: EARLY STAGES OF RECOVERY BOOK
Freq: Once | Status: DC
  Filled 2014-10-29: qty 1

## 2014-10-29 MED ORDER — VITAMIN B-1 100 MG PO TABS
100.0000 mg | ORAL_TABLET | Freq: Every day | ORAL | Status: DC
  Administered 2014-10-30 – 2014-11-01 (×3): 100 mg via ORAL
  Filled 2014-10-29 (×2): qty 1

## 2014-10-29 MED ORDER — ADULT MULTIVITAMIN W/MINERALS CH
1.0000 | ORAL_TABLET | Freq: Every day | ORAL | Status: DC
  Administered 2014-10-30 – 2014-11-01 (×3): 1 via ORAL
  Filled 2014-10-29 (×3): qty 1

## 2014-10-29 MED ORDER — ENOXAPARIN SODIUM 40 MG/0.4ML ~~LOC~~ SOLN
40.0000 mg | SUBCUTANEOUS | Status: DC
  Administered 2014-10-30 – 2014-10-31 (×2): 40 mg via SUBCUTANEOUS
  Filled 2014-10-29 (×3): qty 0.4

## 2014-10-29 MED ORDER — ASENAPINE MALEATE 5 MG SL SUBL
5.0000 mg | SUBLINGUAL_TABLET | Freq: Every day | SUBLINGUAL | Status: DC
  Administered 2014-10-31: 5 mg via SUBLINGUAL
  Filled 2014-10-29 (×5): qty 1

## 2014-10-29 MED ORDER — THIAMINE HCL 100 MG/ML IJ SOLN: 100.0000 mg | Freq: Every day | INTRAMUSCULAR | Status: DC

## 2014-10-29 MED ORDER — LORAZEPAM 1 MG PO TABS: 1.0000 mg | ORAL_TABLET | Freq: Four times a day (QID) | ORAL | Status: DC | PRN

## 2014-10-29 MED ORDER — ASPIRIN 325 MG PO TABS
325.0000 mg | ORAL_TABLET | Freq: Every day | ORAL | Status: DC
  Administered 2014-10-30 – 2014-11-01 (×3): 325 mg via ORAL
  Filled 2014-10-29 (×3): qty 1

## 2014-10-29 MED ORDER — FOLIC ACID 1 MG PO TABS
1.0000 mg | ORAL_TABLET | Freq: Every day | ORAL | Status: DC
  Administered 2014-10-30 – 2014-11-01 (×3): 1 mg via ORAL
  Filled 2014-10-29 (×3): qty 1

## 2014-10-29 MED ORDER — LORAZEPAM 2 MG/ML IJ SOLN: 1.0000 mg | Freq: Four times a day (QID) | INTRAMUSCULAR | Status: DC | PRN

## 2014-10-29 NOTE — ED Notes (Signed)
Pt. Took out IV and stated he cant wait anymore he is going to go and to just send him his results. MD notified

## 2014-10-29 NOTE — H&P (Signed)
Hospitalist Admission History and Physical  Patient name: [name] Medical record number: [id number] Date of birth: [birth year] Age: 49 y.o. Gender: male  Primary Care Provider: [name], PA-C  Chief Complaint: CVA  History of Present Illness:This is a 49 y.o. year old male with significant past medical history of depression, ETOH abuse, congenital heart disease presenting with CVA, chest pain. Pt reports stutterting over the past day. States that this is a new issue for him. Denies any hemiparesis, confusion. No hx/o CVA. Drinks heavily on the weekend-18+ beers per day. Had 1 episode of central chest pain earlier today. Some radiation down the L arm. No assd nausea or diaphoresis. Sxs lasted approx 45 minutes and self resolved. Does report GERD sxs recently. Sxs today not assd w/ eating.  Presented to ER afebrile, hemodynamically stable. CBC and CMP within normal limits. EKG normal sinus rhythm. Troponin negative 2. Chest x-ray within normal limits. Head CT with noted focal area of low attenuation in the left subcortical parietal lobe concerning for subacute versus chronic infarct.  Assessment and Plan: [name] is a 49 y.o. year old male presenting with CVA, chest pain   Active Problems:   Embolic stroke   CVA (cerebral infarction)   1- CVA  -proceed down stroke pathway including MRI, MRA, 2D ECHO, carotid dopplers, risk stratification labs -full dose AS -appreciate neuro input-f/u recs  2- Chest Pain  -somewhat mixed sxs  -HEART score 2-3  -trop neg x1, EKG NSR -full dose ASA -cycle CEs -risk stratification labs -tele bed  3- ETOH abuse -check ETOH level  -UDS -CIWA protocol   4-Depression  -mood stable  -cont home regimen  FEN/GI: heart healthy diet  Prophylaxis: lovenox  Disposition: pending further evaluation  Code Status:Full Code    Patient Active Problem List   Diagnosis Date Noted  . Embolic stroke 25/42/7062  . CVA (cerebral  infarction) 10/29/2014  . Umbilical hernia 37/62/8315   Past Medical History: Past Medical History  Diagnosis Date  . Depression   . Congenital heart disease   . Back pain   . History of ETOH abuse     Past Surgical History: Past Surgical History  Procedure Laterality Date  . Cardiac catheterization      at age 29  . Back surgery Bilateral 1996  . Skin graft    . Carpal tunnel release      Social History: History   Social History  . Marital Status: Married    Spouse Name: N/A  . Number of Children: N/A  . Years of Education: N/A   Social History Main Topics  . Smoking status: Current Every Day Smoker -- 1.00 packs/day    Types: Cigarettes  . Smokeless tobacco: Never Used  . Alcohol Use: Yes     Comment: 24 beers daily and 1 pint of liquor daily  . Drug Use: No  . Sexual Activity: Not on file   Other Topics Concern  . None   Social History Narrative    Family History: Family History  Problem Relation Age of Onset  . Cancer Maternal Grandmother   . Cancer Paternal Grandfather     lymphoma    Allergies: Allergies  Allergen Reactions  . Codeine Nausea And Vomiting    Current Facility-Administered Medications  Medication Dose Route Frequency Provider Last Rate Last Dose  .  stroke: mapping our early stages of recovery book   Does not apply Once Deneise Lever, MD      . [  START ON 10/30/2014] asenapine (SAPHRIS) sublingual tablet 5 mg  5 mg Sublingual Daily Deneise Lever, MD      . Derrill Memo ON 10/30/2014] aspirin suppository 300 mg  300 mg Rectal Daily Deneise Lever, MD       Or  . Derrill Memo ON 10/30/2014] aspirin tablet 325 mg  325 mg Oral Daily Deneise Lever, MD      . benztropine (COGENTIN) tablet 1 mg  1 mg Oral BID Deneise Lever, MD      . diazepam (VALIUM) tablet 5 mg  5 mg Oral Q12H PRN Deneise Lever, MD      . divalproex (DEPAKOTE ER) 24 hr tablet 1,000 mg  1,000 mg Oral BID Deneise Lever, MD      . enoxaparin (LOVENOX) injection 40 mg  40  mg Subcutaneous Q24H Deneise Lever, MD      . Derrill Memo ON 1/61/0960] folic acid (FOLVITE) tablet 1 mg  1 mg Oral Daily Deneise Lever, MD      . LORazepam (ATIVAN) injection 1 mg  1 mg Intravenous Once Orlie Dakin, MD   1 mg at 10/29/14 2020  . LORazepam (ATIVAN) tablet 1 mg  1 mg Oral Q6H PRN Deneise Lever, MD       Or  . LORazepam (ATIVAN) injection 1 mg  1 mg Intravenous Q6H PRN Deneise Lever, MD      . Derrill Memo ON 10/30/2014] multivitamin with minerals tablet 1 tablet  1 tablet Oral Daily Deneise Lever, MD      . Derrill Memo ON 10/30/2014] thiamine (VITAMIN B-1) tablet 100 mg  100 mg Oral Daily Deneise Lever, MD       Or  . Derrill Memo ON 10/30/2014] thiamine (B-1) injection 100 mg  100 mg Intravenous Daily Deneise Lever, MD       Current Outpatient Prescriptions  Medication Sig Dispense Refill  . Asenapine Maleate (SAPHRIS SL) Place 1 tablet under the tongue daily.    . benztropine (COGENTIN) 1 MG tablet Take 1 mg by mouth 2 (two) times daily.    . diazepam (VALIUM) 5 MG tablet Take 5 mg by mouth every 12 (twelve) hours as needed for anxiety.    . divalproex (DEPAKOTE ER) 500 MG 24 hr tablet Take 1,000 mg by mouth 2 (two) times daily.     Marland Kitchen docusate sodium (COLACE) 100 MG capsule Take 1 capsule (100 mg total) by mouth 2 (two) times daily. (Patient not taking: Reported on 10/29/2014) 30 capsule 0  . hydrocortisone (ANUSOL-HC) 2.5 % rectal cream Place 1 application rectally 2 (two) times daily. (Patient not taking: Reported on 10/29/2014) 30 g 0  . hydrocortisone (ANUSOL-HC) 25 MG suppository Place 1 suppository (25 mg total) rectally 2 (two) times daily. (Patient not taking: Reported on 10/29/2014) 12 suppository 0   Review Of Systems: 12 point ROS negative except as noted above in HPI.  Physical Exam: Filed Vitals:   10/29/14 2302  BP: 118/77  Pulse: 80  Temp:   Resp: 13    General: alert and cooperative HEENT: PERRLA and extra ocular movement intact Heart: S1, S2 normal, no  murmur, rub or gallop, regular rate and rhythm Lungs: clear to auscultation, no wheezes or rales and unlabored breathing Abdomen: abdomen is soft without significant tenderness, masses, organomegaly or guarding Extremities: extremities normal, atraumatic, no cyanosis or edema Skin:no rashes Neurology: normal without focal findings  Labs and Imaging: Lab Results  Component Value Date/Time  NA 137 10/29/2014 05:11 PM   K 3.9 10/29/2014 05:11 PM   CL 104 10/29/2014 05:11 PM   CO2 23 10/29/2014 05:11 PM   BUN 10 10/29/2014 05:11 PM   CREATININE 1.02 10/29/2014 05:11 PM   GLUCOSE 81 10/29/2014 05:11 PM   Lab Results  Component Value Date   WBC 9.3 10/29/2014   HGB 13.2 10/29/2014   HCT 39.7 10/29/2014   MCV 89.4 10/29/2014   PLT 236 10/29/2014    Dg Chest 2 View  10/29/2014   CLINICAL DATA:  Acute left chest pain since earlier today radiating to the left arm  EXAM: CHEST  2 VIEW  COMPARISON:  09/16/2006  FINDINGS: The heart size and mediastinal contours are within normal limits. Both lungs are clear. The visualized skeletal structures are unremarkable.  IMPRESSION: No active cardiopulmonary disease.   Electronically Signed   By: Jerilynn Mages.  Shick M.D.   On: 10/29/2014 18:50   Ct Head Wo Contrast  10/29/2014   CLINICAL DATA:  Stuttering.  Left-sided chest pain.  EXAM: CT HEAD WITHOUT CONTRAST  TECHNIQUE: Contiguous axial images were obtained from the base of the skull through the vertex without intravenous contrast.  COMPARISON:  09/16/2006.  FINDINGS: There is a focal area of low attenuation within the left parietal white matter, image 17/series 2. This measures approximately 1.3 cm and is new when compared with 2008. The right cerebral hemisphere and both cerebellar hemispheres appear normal. Mild prominence of the sulci. The ventricular volumes are unremarkable. There is no evidence for intracranial hemorrhage or mass. There is mucosal thickening involving the ethmoid air cells and sphenoid  sinus. The mastoid air cells are clear. The calvarium is intact.  IMPRESSION: 1. Focal area of low attenuation in the left subcortical parietal lobe is new from previous head CT. This is favored to represent either a subacute or chronic infarct.   Electronically Signed   By: Kerby Moors M.D.   On: 10/29/2014 21:32           Shanda Howells MD  Pager: [phone]

## 2014-10-29 NOTE — ED Notes (Signed)
Pt. Refusing MRI and ativan states this dose just isnt enough. MD notified CT scan ordered

## 2014-10-29 NOTE — ED Provider Notes (Addendum)
CSN: [id number]     Arrival date & time 10/29/14  1452 History   First MD Initiated Contact with Patient 10/29/14 1532     Chief Complaint  Patient presents with  . Aphasia  . Chest Pain     (Consider location/radiation/quality/duration/timing/severity/associated sxs/prior Treatment) HPI Patient complains of stuttering speech intermittently onset 7 days ago. Stuttering speech waxes and wanes. Not run on by anything or made worse or better by anything. He admits to heavy alcohol use last week, after which stuttering became worse. He also complains of left-sided anterior chest pain, described as feeling as if he was punched in the chest onset 2 hours ago with numbness in his left arm. Chest pain and numbness resolved spontaneously after 45 minutes. Other associated symptoms include feeling of generalized malaise for the past week. No shortness of breath no nausea or vomiting. No headache no visual changes. No treatment prior to coming here Past Medical History  Diagnosis Date  . Depression   . Congenital heart disease   . Back pain   . History of ETOH abuse    Past Surgical History  Procedure Laterality Date  . Cardiac catheterization      at age 32  . Back surgery Bilateral 1996  . Skin graft    . Carpal tunnel release     Family History  Problem Relation Age of Onset  . Cancer Maternal Grandmother   . Cancer Paternal Grandfather     lymphoma   History  Substance Use Topics  . Smoking status: Current Every Day Smoker -- 1.00 packs/day    Types: Cigarettes  . Smokeless tobacco: Never Used  . Alcohol Use: Yes     Comment: 24 beers daily and 1 pint of liquor daily    Review of Systems  Constitutional: Negative.        Generalized malaise  HENT: Negative.   Respiratory: Negative.   Cardiovascular: Positive for chest pain.  Gastrointestinal: Negative.   Musculoskeletal: Negative.   Skin: Negative.   Neurological: Positive for speech difficulty.  Psychiatric/Behavioral:  Negative.   All other systems reviewed and are negative.     Allergies  Codeine  Home Medications   Prior to Admission medications   Medication Sig Start Date End Date Taking? Authorizing Provider  Asenapine Maleate (SAPHRIS SL) Place 1 tablet under the tongue daily.   Yes Historical Provider, MD  benztropine (COGENTIN) 1 MG tablet Take 1 mg by mouth 2 (two) times daily.   Yes Historical Provider, MD  diazepam (VALIUM) 5 MG tablet Take 5 mg by mouth every 12 (twelve) hours as needed for anxiety.   Yes Historical Provider, MD  divalproex (DEPAKOTE ER) 500 MG 24 hr tablet Take 1,000 mg by mouth 2 (two) times daily.    Yes Historical Provider, MD  docusate sodium (COLACE) 100 MG capsule Take 1 capsule (100 mg total) by mouth 2 (two) times daily. Patient not taking: Reported on 10/29/2014 06/11/14   Camelia Eng Tysinger, PA-C  hydrocortisone (ANUSOL-HC) 2.5 % rectal cream Place 1 application rectally 2 (two) times daily. Patient not taking: Reported on 10/29/2014 06/11/14   Camelia Eng Tysinger, PA-C  hydrocortisone (ANUSOL-HC) 25 MG suppository Place 1 suppository (25 mg total) rectally 2 (two) times daily. Patient not taking: Reported on 10/29/2014 06/11/14   Camelia Eng Tysinger, PA-C   BP 158/83 mmHg  Pulse 96  Temp(Src) 98 F (36.7 C) (Oral)  Resp 20  SpO2 97% Physical Exam  Constitutional: He is oriented to person,  place, and time. He appears well-developed and well-nourished.  HENT:  Head: Normocephalic and atraumatic.  Eyes: Conjunctivae are normal. Pupils are equal, round, and reactive to light.  Neck: Neck supple. No tracheal deviation present. No thyromegaly present.  Cardiovascular: Normal rate and regular rhythm.   No murmur heard. Pulmonary/Chest: Effort normal and breath sounds normal.  Abdominal: Soft. Bowel sounds are normal. He exhibits no distension. There is no tenderness.  Musculoskeletal: Normal range of motion. He exhibits no edema or tenderness.  Neurological: He is alert  and oriented to person, place, and time. He has normal reflexes. No cranial nerve deficit. Coordination normal.  Gait normal Romberg normal finger to nose normal. Speech is clear patient stutters intermittently as I examine and speak with him however speech seems to clear when he is distracted  Skin: Skin is warm and dry. No rash noted.  Psychiatric: He has a normal mood and affect.  Nursing note and vitals reviewed.   ED Course  Procedures (including critical care time) Labs Review Labs Reviewed  CBG MONITORING, ED - Abnormal; Notable for the following:    Glucose-Capillary 125 (*)    All other components within normal limits    Imaging Review No results found.   EKG Interpretation   Date/Time:  Tuesday October 29 2014 14:56:26 EDT Ventricular Rate:  95 PR Interval:  134 QRS Duration: 80 QT Interval:  352 QTC Calculation: 442 R Axis:   37 Text Interpretation:  Normal sinus rhythm Normal ECG No significant change  since last tracing Confirmed by Rajon Bisig  MD, Matalynn Graff [phone]) on 10/29/2014  4:00:40 PM     Patient refused MRI scan after several attempts and medication with intravenous Ativan. He agreed to CT scan. Neurology consult called.  Results for orders placed or performed during the hospital encounter of 10/29/14  Comprehensive metabolic panel  Result Value Ref Range   Sodium 137 135 - 145 mmol/L   Potassium 3.9 3.5 - 5.1 mmol/L   Chloride 104 96 - 112 mmol/L   CO2 23 19 - 32 mmol/L   Glucose, Bld 81 70 - 99 mg/dL   BUN 10 6 - 23 mg/dL   Creatinine, Ser 1.02 0.50 - 1.35 mg/dL   Calcium 8.8 8.4 - 10.5 mg/dL   Total Protein 6.3 6.0 - 8.3 g/dL   Albumin 3.6 3.5 - 5.2 g/dL   AST 20 0 - 37 U/L   ALT 36 0 - 53 U/L   Alkaline Phosphatase 47 39 - 117 U/L   Total Bilirubin 0.5 0.3 - 1.2 mg/dL   GFR calc non Af Amer 84 (L) >90 mL/min   GFR calc Af Amer >90 >90 mL/min   Anion gap 10 5 - 15  CBC with Differential/Platelet  Result Value Ref Range   WBC 9.3 4.0 - 10.5 K/uL    RBC 4.44 4.22 - 5.81 MIL/uL   Hemoglobin 13.2 13.0 - 17.0 g/dL   HCT 39.7 39.0 - 52.0 %   MCV 89.4 78.0 - 100.0 fL   MCH 29.7 26.0 - 34.0 pg   MCHC 33.2 30.0 - 36.0 g/dL   RDW 13.7 11.5 - 15.5 %   Platelets 236 150 - 400 K/uL   Neutrophils Relative % 61 43 - 77 %   Neutro Abs 5.6 1.7 - 7.7 K/uL   Lymphocytes Relative 24 12 - 46 %   Lymphs Abs 2.2 0.7 - 4.0 K/uL   Monocytes Relative 13 (H) 3 - 12 %   Monocytes Absolute 1.2 (  H) 0.1 - 1.0 K/uL   Eosinophils Relative 2 0 - 5 %   Eosinophils Absolute 0.2 0.0 - 0.7 K/uL   Basophils Relative 0 0 - 1 %   Basophils Absolute 0.0 0.0 - 0.1 K/uL  Valproic acid level  Result Value Ref Range   Valproic Acid Lvl <10.0 (L) 50.0 - 100.0 ug/mL  CBG monitoring, ED  Result Value Ref Range   Glucose-Capillary 125 (H) 70 - 99 mg/dL  I-stat troponin, ED  Result Value Ref Range   Troponin i, poc 0.00 0.00 - 0.08 ng/mL   Comment 3          I-stat troponin, ED  Result Value Ref Range   Troponin i, poc 0.01 0.00 - 0.08 ng/mL   Comment 3           Dg Chest 2 View  10/29/2014   CLINICAL DATA:  Acute left chest pain since earlier today radiating to the left arm  EXAM: CHEST  2 VIEW  COMPARISON:  09/16/2006  FINDINGS: The heart size and mediastinal contours are within normal limits. Both lungs are clear. The visualized skeletal structures are unremarkable.  IMPRESSION: No active cardiopulmonary disease.   Electronically Signed   By: Jerilynn Mages.  Shick M.D.   On: 10/29/2014 18:50   Ct Head Wo Contrast  10/29/2014   CLINICAL DATA:  Stuttering.  Left-sided chest pain.  EXAM: CT HEAD WITHOUT CONTRAST  TECHNIQUE: Contiguous axial images were obtained from the base of the skull through the vertex without intravenous contrast.  COMPARISON:  09/16/2006.  FINDINGS: There is a focal area of low attenuation within the left parietal white matter, image 17/series 2. This measures approximately 1.3 cm and is new when compared with 2008. The right cerebral hemisphere and both  cerebellar hemispheres appear normal. Mild prominence of the sulci. The ventricular volumes are unremarkable. There is no evidence for intracranial hemorrhage or mass. There is mucosal thickening involving the ethmoid air cells and sphenoid sinus. The mastoid air cells are clear. The calvarium is intact.  IMPRESSION: 1. Focal area of low attenuation in the left subcortical parietal lobe is new from previous head CT. This is favored to represent either a subacute or chronic infarct.   Electronically Signed   By: Kerby Moors M.D.   On: 10/29/2014 21:32    MDM  Cardiac risk factors smoker otherwise negative Heart score equals 3. Dr. Leonel Ramsay feels that CT scan of brain correlates to embolic stroke. Plan aspirin. Hospitalist consult for admission. Patient passed swallowing screen Patient should be watched for alcohol withdrawal. Spoke with Dr.Newton plan admit telemetry Final diagnoses:  None   diagnosis #1 subacute embolic stroke #2 chest pain      Orlie Dakin, MD 10/29/14 2440  Orlie Dakin, MD 10/29/14 2328

## 2014-10-29 NOTE — ED Notes (Signed)
Pt states that he had drank 15 beers on Monday one week ago and took 2 doses of one of his medications. Unsure of which one. Pt reports n/v that night. Pt then reports having slurred speech and stuttering Tuesday 10-22-14. Pt states that this has continued. Pt saw his psychiatrist about this and was told that it was stress related. Pt stuttering has continued with no complete resolve. Pt reports left sided chest pain that started today at 2pm. Pt states that pain radiated to left arm.

## 2014-10-29 NOTE — ED Notes (Signed)
Patient transported to MRI 

## 2014-10-29 NOTE — ED Notes (Signed)
Delay in blood collection due to patient still being in MRI

## 2014-10-30 ENCOUNTER — Encounter (HOSPITAL_COMMUNITY): Payer: Self-pay | Admitting: General Practice

## 2014-10-30 ENCOUNTER — Observation Stay (HOSPITAL_COMMUNITY): Payer: Managed Care, Other (non HMO)

## 2014-10-30 DIAGNOSIS — F1010 Alcohol abuse, uncomplicated: Secondary | ICD-10-CM | POA: Diagnosis present

## 2014-10-30 DIAGNOSIS — F329 Major depressive disorder, single episode, unspecified: Secondary | ICD-10-CM | POA: Diagnosis present

## 2014-10-30 DIAGNOSIS — F1210 Cannabis abuse, uncomplicated: Secondary | ICD-10-CM | POA: Insufficient documentation

## 2014-10-30 DIAGNOSIS — I63412 Cerebral infarction due to embolism of left middle cerebral artery: Secondary | ICD-10-CM | POA: Diagnosis present

## 2014-10-30 DIAGNOSIS — I639 Cerebral infarction, unspecified: Secondary | ICD-10-CM | POA: Diagnosis not present

## 2014-10-30 DIAGNOSIS — F17210 Nicotine dependence, cigarettes, uncomplicated: Secondary | ICD-10-CM | POA: Diagnosis present

## 2014-10-30 DIAGNOSIS — M545 Low back pain: Secondary | ICD-10-CM | POA: Diagnosis present

## 2014-10-30 DIAGNOSIS — F419 Anxiety disorder, unspecified: Secondary | ICD-10-CM | POA: Diagnosis present

## 2014-10-30 DIAGNOSIS — Z720 Tobacco use: Secondary | ICD-10-CM | POA: Diagnosis not present

## 2014-10-30 DIAGNOSIS — F17200 Nicotine dependence, unspecified, uncomplicated: Secondary | ICD-10-CM | POA: Insufficient documentation

## 2014-10-30 DIAGNOSIS — Z6837 Body mass index (BMI) 37.0-37.9, adult: Secondary | ICD-10-CM | POA: Diagnosis not present

## 2014-10-30 DIAGNOSIS — I6340 Cerebral infarction due to embolism of unspecified cerebral artery: Secondary | ICD-10-CM | POA: Diagnosis not present

## 2014-10-30 DIAGNOSIS — I6789 Other cerebrovascular disease: Secondary | ICD-10-CM | POA: Diagnosis not present

## 2014-10-30 DIAGNOSIS — R079 Chest pain, unspecified: Secondary | ICD-10-CM | POA: Diagnosis present

## 2014-10-30 DIAGNOSIS — I10 Essential (primary) hypertension: Secondary | ICD-10-CM | POA: Diagnosis present

## 2014-10-30 DIAGNOSIS — K219 Gastro-esophageal reflux disease without esophagitis: Secondary | ICD-10-CM | POA: Diagnosis present

## 2014-10-30 DIAGNOSIS — E785 Hyperlipidemia, unspecified: Secondary | ICD-10-CM | POA: Diagnosis present

## 2014-10-30 DIAGNOSIS — G4733 Obstructive sleep apnea (adult) (pediatric): Secondary | ICD-10-CM | POA: Diagnosis present

## 2014-10-30 LAB — CREATININE, SERUM
Creatinine, Ser: 1.08 mg/dL (ref 0.50–1.35)
GFR calc non Af Amer: 78 mL/min — ABNORMAL LOW (ref >90)

## 2014-10-30 LAB — LIPID PANEL
CHOLESTEROL: 216 mg/dL — AB (ref 0–200)
HDL: 23 mg/dL — ABNORMAL LOW (ref >39)
LDL Cholesterol: UNDETERMINED mg/dL (ref 0–99)
TRIGLYCERIDES: 450 mg/dL — AB (ref <150)
Total CHOL/HDL Ratio: 9.4 RATIO
VLDL: UNDETERMINED mg/dL (ref 0–40)

## 2014-10-30 LAB — CBC
HCT: 41 % (ref 39.0–52.0)
Hemoglobin: 13.3 g/dL (ref 13.0–17.0)
MCH: 29.4 pg (ref 26.0–34.0)
MCHC: 32.4 g/dL (ref 30.0–36.0)
MCV: 90.5 fL (ref 78.0–100.0)
PLATELETS: 247 10*3/uL (ref 150–400)
RBC: 4.53 MIL/uL (ref 4.22–5.81)
RDW: 13.8 % (ref 11.5–15.5)
WBC: 9.8 10*3/uL (ref 4.0–10.5)

## 2014-10-30 LAB — ETHANOL

## 2014-10-30 LAB — TROPONIN I

## 2014-10-30 LAB — RAPID URINE DRUG SCREEN, HOSP PERFORMED
AMPHETAMINES: NOT DETECTED
Barbiturates: NOT DETECTED
Benzodiazepines: POSITIVE — AB
Cocaine: NOT DETECTED
OPIATES: NOT DETECTED
TETRAHYDROCANNABINOL: POSITIVE — AB

## 2014-10-30 MED ORDER — LORAZEPAM 2 MG/ML IJ SOLN: 0.0000 mg | Freq: Four times a day (QID) | INTRAMUSCULAR | Status: DC

## 2014-10-30 MED ORDER — GI COCKTAIL ~~LOC~~
30.0000 mL | Freq: Once | ORAL | Status: AC
  Administered 2014-10-30: 30 mL via ORAL
  Filled 2014-10-30: qty 30

## 2014-10-30 MED ORDER — PANTOPRAZOLE SODIUM 40 MG PO TBEC
40.0000 mg | DELAYED_RELEASE_TABLET | Freq: Every day | ORAL | Status: DC
  Administered 2014-10-30 – 2014-11-01 (×3): 40 mg via ORAL
  Filled 2014-10-30 (×3): qty 1

## 2014-10-30 MED ORDER — EZETIMIBE 10 MG PO TABS
10.0000 mg | ORAL_TABLET | Freq: Every day | ORAL | Status: DC
  Administered 2014-10-31: 10 mg via ORAL
  Filled 2014-10-30: qty 1

## 2014-10-30 MED ORDER — THIAMINE HCL 100 MG/ML IJ SOLN: 100.0000 mg | Freq: Every day | INTRAMUSCULAR | Status: DC

## 2014-10-30 MED ORDER — VITAMIN B-1 100 MG PO TABS: 100.0000 mg | ORAL_TABLET | Freq: Every day | ORAL | Status: DC

## 2014-10-30 MED ORDER — NICOTINE 21 MG/24HR TD PT24
21.0000 mg | MEDICATED_PATCH | Freq: Every day | TRANSDERMAL | Status: DC
  Filled 2014-10-30 (×3): qty 1

## 2014-10-30 MED ORDER — HALOPERIDOL LACTATE 5 MG/ML IJ SOLN
5.0000 mg | Freq: Once | INTRAMUSCULAR | Status: AC
  Administered 2014-10-30: 5 mg via INTRAMUSCULAR
  Filled 2014-10-30: qty 1

## 2014-10-30 MED ORDER — LORAZEPAM 2 MG/ML IJ SOLN: 0.0000 mg | Freq: Two times a day (BID) | INTRAMUSCULAR | Status: DC

## 2014-10-30 MED ORDER — LORAZEPAM 1 MG PO TABS
0.0000 mg | ORAL_TABLET | Freq: Four times a day (QID) | ORAL | Status: AC
  Administered 2014-10-30: 1 mg via ORAL
  Administered 2014-10-30 (×2): 2 mg via ORAL
  Filled 2014-10-30: qty 2
  Filled 2014-10-30: qty 1

## 2014-10-30 MED ORDER — ATORVASTATIN CALCIUM 40 MG PO TABS
40.0000 mg | ORAL_TABLET | Freq: Every day | ORAL | Status: DC
  Administered 2014-10-31 – 2014-11-01 (×2): 40 mg via ORAL
  Filled 2014-10-30 (×2): qty 1

## 2014-10-30 MED ORDER — LORAZEPAM 1 MG PO TABS
0.0000 mg | ORAL_TABLET | Freq: Two times a day (BID) | ORAL | Status: DC
  Filled 2014-10-30: qty 2

## 2014-10-30 NOTE — Progress Notes (Signed)
UR completed 

## 2014-10-30 NOTE — Evaluation (Signed)
Speech Language Pathology Evaluation Patient Details Name: [name] MRN: [id number] DOB: 07-Sep-1965 Today's Date: 10/30/2014 Time: 2440-1027 SLP Time Calculation (min) (ACUTE ONLY): 27 min  Problem List:  Patient Active Problem List   Diagnosis Date Noted  . Embolic stroke 25/36/6440  . CVA (cerebral infarction) 10/29/2014  . Umbilical hernia 34/74/2595   Past Medical History:  Past Medical History  Diagnosis Date  . Depression   . Congenital heart disease   . Back pain   . History of ETOH abuse    Past Surgical History:  Past Surgical History  Procedure Laterality Date  . Cardiac catheterization      at age 89  . Back surgery Bilateral 1996  . Skin graft    . Carpal tunnel release     HPI:  49 y.o. year old male with significant past medical history of depression, ETOH abuse, congenital heart disease presenting stuttering, chest pain. Drinks heavily on the weekend-18+ beers per day. Head CT with noted focal area of low attenuation in the left subcortical parietal lobe concerning for subacute   Assessment / Plan / Recommendation Clinical Impression  Pt exhibits mild-moderate motor speech impairment/apraxia characterized by halting, dysfluent (intermittent part word and whole word repetition) and hesitant speech. Initially speech appeared somewhat telegraphic omiting parts of speech. Occasional phonemic paraphasias present. He reports he completed the 8th grade and is iliterate with baseline memory challenges. Awareness, problem solving appear baseline (history of anger management issues per pt). SLP provided education/intervention re: strategies to facilitate communication with demonstration. Recommend continue ST in acute care and at discharge (home health? insurance?).      SLP Assessment  Patient needs continued Speech Lanaguage Pathology Services    Follow Up Recommendations  Home health SLP    Frequency and Duration min 2x/week  2 weeks   Pertinent Vitals/Pain  Pain Assessment: No/denies pain   SLP Goals  Potential to Achieve Goals (ACUTE ONLY): Good Potential Considerations (ACUTE ONLY): Previous level of function  SLP Evaluation Prior Functioning  Cognitive/Linguistic Baseline: Baseline deficits (reports long term memory difficulty)  Lives With: Spouse Vocation: Unemployed (applied for disability)   Cognition  Overall Cognitive Status: Impaired/Different from baseline Arousal/Alertness: Awake/alert Orientation Level: Oriented X4 Attention: Sustained Sustained Attention: Appears intact Memory: Impaired Memory Impairment:  (reports baseline memory, appeared fxl will assess further) Awareness: Appears intact Problem Solving: Appears intact Safety/Judgment:  (questionable)    Comprehension  Auditory Comprehension Overall Auditory Comprehension: Appears within functional limits for tasks assessed Yes/No Questions: Within Functional Limits Commands: Within Functional Limits Conversation: Simple Visual Recognition/Discrimination Discrimination: Not tested Reading Comprehension Reading Status:  (pt states he is iliterate)    Expression Expression Primary Mode of Expression: Verbal Verbal Expression Overall Verbal Expression: Impaired Initiation: Impaired Level of Generative/Spontaneous Verbalization: Conversation Repetition: No impairment Naming: No impairment Pragmatics: Impairment Impairments: Monotone;Dysprosody;Topic maintenance Written Expression Dominant Hand: Right Written Expression:  (pt states he is iliterate)   Oral / Motor Oral Motor/Sensory Function Overall Oral Motor/Sensory Function: Appears within functional limits for tasks assessed Motor Speech Overall Motor Speech: Impaired Respiration: Within functional limits Phonation: Normal Resonance: Within functional limits Articulation: Impaired Level of Impairment: Sentence Intelligibility: Intelligible Motor Planning: Impaired Level of Impairment:  Sentence Motor Speech Errors: Groping for words;Aware (slight) Effective Techniques: Slow rate   GO Functional Assessment Tool Used:  (skilled clinical judgement) Functional Limitations: Motor speech Motor Speech Current Status [phone]): At least 40 percent but less than 60 percent impaired, limited or restricted Motor Speech Goal Status [phone]): At  least 20 percent but less than 40 percent impaired, limited or restricted   Houston Siren 10/30/2014, 10:33 AM  Orbie Pyo Colvin Caroli.Ed [employer] [phone]

## 2014-10-30 NOTE — Progress Notes (Signed)
  Echocardiogram 2D Echocardiogram has been performed.  Donata Clay 10/30/2014, 2:10 PM

## 2014-10-30 NOTE — Progress Notes (Signed)
[facility] PROGRESS NOTE  [name] NWG:[id number] DOB: February 12, 1966 DOA: 10/29/2014 PCP: [name], PA-C  Assessment/Plan: Active Problems:   Embolic stroke/CVA (cerebral infarction) - Neurology on board and currently assisting - Workup underway final recommendations to come from neurologist once workup complete - Routine stroke order set placed - MRI pending, patient had Ativan which was not successful calming patient down prior to MRI. Plan is for Haldol  Chest pain - Most likely secondary to reflux as such will try trial of PPI. Patient reports history of reflux of which he has been unable to afford home medication regimen due to issues of cost. Patient states he had some toms which helped - Troponins 3 negative  Code Status: full Family Communication: d/c Disposition Plan: Pending further workup and recommendations by neurology   Consultants:  Neurology  Procedures:  Please refer to EMR  Antibiotics:  None  HPI/Subjective: Patient has no new complaints. Stating that he had difficulty obtaining MRI and became anxious. Stated that the Ativan did not help him. States that he has history of reflux which affects him daily. He was on prescription medication at home.  Objective: Filed Vitals:   10/30/14 1429  BP: 120/76  Pulse: 75  Temp: 97.8 F (36.6 C)  Resp: 16   No intake or output data in the 24 hours ending 10/30/14 1654 Filed Weights   10/30/14 0100  Weight: 104.6 kg (230 lb 9.6 oz)    Exam:   General:  Patient in no acute distress, alert and awake  Cardiovascular: Regular rate and rhythm, no murmurs or rubs  Respiratory: Here to auscultation bilaterally, no wheezes  Abdomen: Soft, nondistended, nontender  Musculoskeletal: No cyanosis or clubbing   Data Reviewed: Basic Metabolic Panel:  Recent Labs Lab 10/29/14 1711 10/30/14 0130  NA 137  --   K 3.9  --   CL 104  --   CO2 23  --   GLUCOSE 81  --   BUN 10  --    CREATININE 1.02 1.08  CALCIUM 8.8  --    Liver Function Tests:  Recent Labs Lab 10/29/14 1711  AST 20  ALT 36  ALKPHOS 47  BILITOT 0.5  PROT 6.3  ALBUMIN 3.6   No results for input(s): LIPASE, AMYLASE in the last 168 hours. No results for input(s): AMMONIA in the last 168 hours. CBC:  Recent Labs Lab 10/29/14 1711 10/30/14 0130  WBC 9.3 9.8  NEUTROABS 5.6  --   HGB 13.2 13.3  HCT 39.7 41.0  MCV 89.4 90.5  PLT 236 247   Cardiac Enzymes:  Recent Labs Lab 10/30/14 0130 10/30/14 0530 10/30/14 1137  TROPONINI <0.03 <0.03 <0.03   BNP (last 3 results) No results for input(s): BNP in the last 8760 hours.  ProBNP (last 3 results) No results for input(s): PROBNP in the last 8760 hours.  CBG:  Recent Labs Lab 10/29/14 1459  GLUCAP 125*    No results found for this or any previous visit (from the past 240 hour(s)).   Studies: Dg Chest 2 View  10/29/2014   CLINICAL DATA:  Acute left chest pain since earlier today radiating to the left arm  EXAM: CHEST  2 VIEW  COMPARISON:  09/16/2006  FINDINGS: The heart size and mediastinal contours are within normal limits. Both lungs are clear. The visualized skeletal structures are unremarkable.  IMPRESSION: No active cardiopulmonary disease.   Electronically Signed   By: Jerilynn Mages.  Shick M.D.   On: 10/29/2014 18:50  Ct Head Wo Contrast  10/29/2014   CLINICAL DATA:  Stuttering.  Left-sided chest pain.  EXAM: CT HEAD WITHOUT CONTRAST  TECHNIQUE: Contiguous axial images were obtained from the base of the skull through the vertex without intravenous contrast.  COMPARISON:  09/16/2006.  FINDINGS: There is a focal area of low attenuation within the left parietal white matter, image 17/series 2. This measures approximately 1.3 cm and is new when compared with 2008. The right cerebral hemisphere and both cerebellar hemispheres appear normal. Mild prominence of the sulci. The ventricular volumes are unremarkable. There is no evidence for  intracranial hemorrhage or mass. There is mucosal thickening involving the ethmoid air cells and sphenoid sinus. The mastoid air cells are clear. The calvarium is intact.  IMPRESSION: 1. Focal area of low attenuation in the left subcortical parietal lobe is new from previous head CT. This is favored to represent either a subacute or chronic infarct.   Electronically Signed   By: Kerby Moors M.D.   On: 10/29/2014 21:32    Scheduled Meds: .  stroke: mapping our early stages of recovery book   Does not apply Once  . asenapine  5 mg Sublingual Daily  . aspirin  300 mg Rectal Daily   Or  . aspirin  325 mg Oral Daily  . benztropine  1 mg Oral BID  . divalproex  1,000 mg Oral BID  . enoxaparin (LOVENOX) injection  40 mg Subcutaneous Q24H  . folic acid  1 mg Oral Daily  . LORazepam  1 mg Intravenous Once  . LORazepam  0-4 mg Oral 4 times per day  . LORazepam  0-4 mg Oral Q12H  . multivitamin with minerals  1 tablet Oral Daily  . nicotine  21 mg Transdermal Daily  . pantoprazole  40 mg Oral Daily  . thiamine  100 mg Oral Daily   Or  . thiamine  100 mg Intravenous Daily   Continuous Infusions:    Time spent: > 35 minutes    Velvet Bathe  [facility] Pager [phone]. If 7PM-7AM, please contact night-coverage at [email or web address], password [facility] 10/30/2014, 4:54 PM  LOS: 1 day

## 2014-10-30 NOTE — Evaluation (Signed)
Occupational Therapy Evaluation/ discharge Patient Details Name: [name] MRN: [id number] DOB: 06-13-1966 Today's Date: 10/30/2014    History of Present Illness 49 yo male admitted with difficulty with speak since Sunday. Pt with hx of heavy ETOH use. Pending stroke workup MRI PMH: back surg, etoh abuse   Clinical Impression   Patient evaluated by Occupational Therapy with no further acute OT needs identified. All education has been completed and the patient has no further questions. See below for any follow-up Occupational Therapy or equipment needs. OT to sign off. Thank you for referral.   Does not appear to need OT acutely and near or at baseline of function.    Follow Up Recommendations  No OT follow up    Equipment Recommendations  None recommended by OT    Recommendations for Other Services       Precautions / Restrictions Precautions Precautions: None      Mobility Bed Mobility Overal bed mobility: Independent                Transfers Overall transfer level: Independent                    Balance Overall balance assessment: Modified Independent                               Standardized Balance Assessment Standardized Balance Assessment : Dynamic Gait Index   Dynamic Gait Index Level Surface: Normal Change in Gait Speed: Normal Gait with Horizontal Head Turns: Normal Gait with Vertical Head Turns: Normal Gait and Pivot Turn: Normal Step Over Obstacle: Normal Step Around Obstacles: Normal      ADL Overall ADL's : At baseline                                             Vision     Perception     Praxis      Pertinent Vitals/Pain Pain Assessment: No/denies pain     Hand Dominance Right   Extremity/Trunk Assessment Upper Extremity Assessment Upper Extremity Assessment: Overall WFL for tasks assessed   Lower Extremity Assessment Lower Extremity Assessment: Overall WFL for tasks  assessed   Cervical / Trunk Assessment Cervical / Trunk Assessment: Other exceptions (hx of back surg)   Communication Communication Communication: Expressive difficulties   Cognition Arousal/Alertness: Awake/alert Behavior During Therapy: WFL for tasks assessed/performed Overall Cognitive Status: No family/caregiver present to determine baseline cognitive functioning (appears to be baseline- hx of head injury per patient)                     General Comments       Exercises       Shoulder Instructions      Home Living Family/patient expects to be discharged to:: Private residence Living Arrangements: Spouse/significant other Available Help at Discharge: Family Type of Home: House       Home Layout: One level     Bathroom Shower/Tub: Occupational psychologist: Standard            Lives With: Spouse    Prior Functioning/Environment Level of Independence: Independent             OT Diagnosis:     OT Problem List:     OT Treatment/Interventions:  OT Goals(Current goals can be found in the care plan section) Acute Rehab OT Goals Patient Stated Goal: to return home to cat "peanut"  OT Frequency:     Barriers to D/C:            Co-evaluation              End of Session Equipment Utilized During Treatment: Gait belt  Activity Tolerance: Patient tolerated treatment well Patient left: in chair;with call bell/phone within reach;Other (comment) (SLP present)   Time: 0946-1000 OT Time Calculation (min): 14 min Charges:  OT General Charges $OT Visit: 1 Procedure OT Evaluation $Initial OT Evaluation Tier I: 1 Procedure G-Codes: OT G-codes **NOT FOR INPATIENT CLASS** Functional Assessment Tool Used: clinical judgement Functional Limitation: Self care Self Care Current Status (H3716): 0 percent impaired, limited or restricted Self Care Goal Status (R6789): 0 percent impaired, limited or restricted Self Care Discharge Status  (F8101): 0 percent impaired, limited or restricted  Parke Poisson B 10/30/2014, 10:37 AM  Pager: [phone]

## 2014-10-30 NOTE — Progress Notes (Signed)
PT Cancellation and Discharge Note  Patient Details Name: [name] MRN: [id number] DOB: 11/04/1965   Cancelled Treatment:    Reason Eval/Treat Not Completed: PT screened, no needs identified, will sign off.  Pt up ambulating independently in hallways and did well with OT eval this am including balance testing.  No acute PT needs at this time, will sign off.     Keylan Costabile, Thornton Papas 10/30/2014, 3:27 PM

## 2014-10-30 NOTE — Progress Notes (Signed)
STROKE TEAM PROGRESS NOTE   HISTORY [name] is a 49 y.o. male who noticed that he began having some difficulty speaking last Sunday. He reports that the symptoms have been persistent since that time. He has some difficulty understanding, but not much. He does have difficulty with word finding, and occasionally misspeaks. He deneis weakness, numbness, visual changes, difficulty walking or other symtpoms. He had chest pain earlier today that is resolved at this time. He described it as "like someone punched me in the left side of my chest." Patient was not administered TPA secondary to delay in arrival. He was admitted for further evaluation and treatment.   SUBJECTIVE (INTERVAL HISTORY) His mother and wife are at the bedside.  Overall he feels his condition is stable. He admits he has an anger issue and is very upset in the delay and progression of his care. 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 He followed with [facility] until 49 yo. He does not have cardiac issue since then. Denies palpitation or heart fluttering, but do endorse symptoms of OSP.    OBJECTIVE Temp:  [97.7 F (36.5 C)-98.7 F (37.1 C)] 98.1 F (36.7 C) (04/27 0900) Pulse Rate:  [62-96] 89 (04/27 0900) Cardiac Rhythm:  [-] Normal sinus rhythm (04/27 0100) Resp:  [11-22] 16 (04/27 0900) BP: (112-158)/(55-105) 121/55 mmHg (04/27 0654) SpO2:  [93 %-97 %] 93 % (04/27 0900) Weight:  [104.6 kg (230 lb 9.6 oz)] 104.6 kg (230 lb 9.6 oz) (04/27 0100)   Recent Labs Lab 10/29/14 1459  GLUCAP 125*    Recent Labs Lab 10/29/14 1711 10/30/14 0130  NA 137  --   K 3.9  --   CL 104  --   CO2 23  --   GLUCOSE 81  --   BUN 10  --   CREATININE 1.02 1.08  CALCIUM 8.8  --     Recent Labs Lab 10/29/14 1711  AST 20  ALT 36  ALKPHOS 47  BILITOT 0.5  PROT 6.3  ALBUMIN 3.6    Recent Labs Lab 10/29/14 1711 10/30/14 0130  WBC 9.3 9.8  NEUTROABS 5.6  --   HGB 13.2  13.3  HCT 39.7 41.0  MCV 89.4 90.5  PLT 236 247    Recent Labs Lab 10/30/14 0130 10/30/14 0530  TROPONINI <0.03 <0.03   No results for input(s): LABPROT, INR in the last 72 hours. No results for input(s): COLORURINE, LABSPEC, [place], GLUCOSEU, HGBUR, BILIRUBINUR, KETONESUR, PROTEINUR, UROBILINOGEN, NITRITE, LEUKOCYTESUR in the last 72 hours.  Invalid input(s): APPERANCEUR     Component Value Date/Time   CHOL 216* 10/30/2014 0530   TRIG 450* 10/30/2014 0530   HDL 23* 10/30/2014 0530   CHOLHDL 9.4 10/30/2014 0530   VLDL UNABLE TO CALCULATE IF TRIGLYCERIDE OVER 400 mg/dL 10/30/2014 0530   LDLCALC UNABLE TO CALCULATE IF TRIGLYCERIDE OVER 400 mg/dL 10/30/2014 0530   No results found for: HGBA1C    Component Value Date/Time   LABOPIA NONE DETECTED 10/30/2014 0155   COCAINSCRNUR NONE DETECTED 10/30/2014 0155   LABBENZ POSITIVE* 10/30/2014 0155   AMPHETMU NONE DETECTED 10/30/2014 0155   THCU POSITIVE* 10/30/2014 0155   LABBARB NONE DETECTED 10/30/2014 0155     Recent Labs Lab 10/30/14 0130  ETH <5   I have personally reviewed the radiological images below and agree with the radiology interpretations.  Dg Chest 2 View 10/29/2014    No active cardiopulmonary disease.   Ct Head  Wo Contrast 10/29/2014   1. Focal area of low attenuation in the left subcortical parietal lobe is new from previous head CT. This is favored to represent either a subacute or chronic infarct.    MRI Head  10/30/2014    Acute nonhemorrhagic left MCA infarct involving the posterior left sylvian fissure and lateral left temporal lobe. 2. No acute hemorrhage.   2D Echocardiogram   - Left ventricle: The cavity size was normal. Wall thickness wasincreased in a pattern of mild LVH. The estimated ejectionfraction was 60%. Wall motion was normal; there were no regionalwall motion abnormalities. - Left atrium: The atrium was mildly dilated. - Right ventricle: The cavity size was normal. Systolic  functionwas normal. Impressions: No cardiac source of embolism was identified, butcannot be ruled out on the basis of this examination.  CTA head and neck -  Pending   PHYSICAL EXAM Physical exam  Temp:  [97.5 F (36.4 C)-98.2 F (36.8 C)] 98.2 F (36.8 C) (04/27 2126) Pulse Rate:  [62-89] 85 (04/27 2126) Resp:  [11-18] 18 (04/27 2126) BP: (112-137)/(55-81) 127/75 mmHg (04/27 2126) SpO2:  [93 %-100 %] 100 % (04/27 2126) Weight:  [230 lb 9.6 oz (104.6 kg)] 230 lb 9.6 oz (104.6 kg) (04/27 0100)  General - obesity, well developed, in no apparent distress.  Ophthalmologic - Sharp disc margins OU.  Cardiovascular - Regular rate and rhythm with no murmur.  Mental Status -  Level of arousal and orientation to time, place, and person were intact. Language including expression, naming, comprehension was assessed and found intact, but mild repetition difficulty. . Attention span and concentration were normal. Recent and remote memory were intact. Fund of Knowledge was assessed and was intact.  Cranial Nerves II - XII - II - Visual field intact OU. III, IV, VI - Extraocular movements intact. V - Facial sensation intact bilaterally. VII - Facial movement intact bilaterally. VIII - Hearing & vestibular intact bilaterally. X - Palate elevates symmetrically. XI - Chin turning & shoulder shrug intact bilaterally. XII - Tongue protrusion intact.  Motor Strength - The patient's strength was normal in all extremities and pronator drift was absent.  Bulk was normal and fasciculations were absent.   Motor Tone - Muscle tone was assessed at the neck and appendages and was normal.  Reflexes - The patient's reflexes were 1+ in all extremities and he had no pathological reflexes.  Sensory - Light touch, temperature/pinprick were assessed and were symmetrical.    Coordination - The patient had normal movements in the hands and feet with no ataxia or dysmetria.  Tremor was absent.  Gait and  Station - The patient's transfers, posture, gait, station, and turns were observed as normal.   ASSESSMENT/PLAN Mr. [name] is a 49 y.o. male with history of depression, ETOH abuse, smoker, myocarditis as young presenting with difficulty speaking x 5 days and chest pain x 1 day. He did not receive IV t-PA due to delay in arrival.   Stroke:  left MCA infarct, embolic pattern, secondary to unknown source, workup underway  Resultant  Mild repetition difficulty  MRI  L MCA infarcts - posterior left sylvian fissure and lateral left temporal lobe  CTA head and neck - pending  2D Echo  No source of embolus   LDL unable to calculate, due to high TG  HgbA1c pending  Will consider TEE and loop if above work up negative findings.  Lovenox 40 mg sq daily for VTE prophylaxis  Diet Heart Room service appropriate?:  Yes; Fluid consistency:: Thin  no antithrombotic prior to admission, now on aspirin 325 mg orally every day  Patient counseled to be compliant with his antithrombotic medications  Ongoing aggressive stroke risk factor management  Therapy recommendations:  No PT or OT, HH SLP  Disposition:  Home with SLP  Hyperlipidemia  Home meds:  No statin   LDL unable to calculate, goal < 70  Add lipitor 40 and zetia 10mg   Continue statin and zetia at discharge  Tobacco abuse  Current smoker  Smoking cessation counseling provided  Nicotine patch provided  Pt is willing to quit  OSA  Undiagnosed  Symptoms consistent with OSA as per wife  Will need OP sleep study  Other Stroke Risk Factors  ETOH use - 12-18 beers daily, has been drinking since the age of 20. On CIWA protocol.    UDS positive for THC, which is a stroke risk factor  Obesity, Body mass index is 37.24 kg/(m^2).   Likely undiagnosed Obstructive sleep apnea  Other Active Problems  Chest pain, felt to be reflux. Troponin's neg. Not taking his medication d/t cost.   Low back  pain  Depression/anxiety/"anger" issues, on depakote per pt  Other Pertinent History  Congenital heart disease at 50mo of age, on digoxin until age 43/12. Followed at [place] [facility] until the age of 45.   Hx RLE "clot" at 11 mo in the setting of CHF at time of "mycarditis" in 1965 per mother  Hospital day # [place] for Pager information 10/30/2014 6:24 PM   49 yo M with mulitple stroke risk factors including anxiety, depression, smoker, alcohol use admitted for left MCA stroke. Stroke consistent with embolic stroke, waiting for CTA head and neck. Found to have high TG and LDL, put on lipitor and zetia. Likely to have OSA but need OP sleep study. Need to quit smoking and alcohol. Regular exercise and healthy diet.   Rosalin Hawking, MD PhD Stroke Neurology 10/30/2014 10:22 PM  To contact Stroke Continuity provider, please refer to [email or web address] After hours, contact General Neurology

## 2014-10-30 NOTE — Consult Note (Signed)
Neurology Consultation Reason for Consult: Stroke Referring Physician: Winfred Leeds, S  CC: Stroke  History is obtained from:patient  HPI: [name] is a 49 y.o. male who noticed that he began having some difficulty speaking last Sunday. He reports that the symptoms have been persistent since that time. He has some difficulty understanding, but not much. He does have difficulty with word finding, and occasionally misspeaks.   He deneis weakness, numbness, visual changes, difficulty walking or other symtpoms.  He had chest pain earlier today that is resolved at this time. He described it as "like someone punched me in the left side of my chest."   LKW: Sunday tpa given?: no, out of window    ROS: A 14 point ROS was performed and is negative except as noted in the HPI.   Past Medical History  Diagnosis Date  . Depression   . Congenital heart disease   . Back pain   . History of ETOH abuse     Family History: cancer  Social History: Tob: occasional   Exam: Current vital signs: BP 118/65 mmHg  Pulse 80  Temp(Src) 98.7 F (37.1 C) (Oral)  Resp 11  SpO2 97% Vital signs in last 24 hours: Temp:  [98 F (36.7 C)-98.7 F (37.1 C)] 98.7 F (37.1 C) (04/26 2200) Pulse Rate:  [67-96] 80 (04/26 2302) Resp:  [11-22] 11 (04/27 0015) BP: (118-158)/(65-105) 118/65 mmHg (04/27 0015) SpO2:  [93 %-97 %] 97 % (04/26 2302)   Physical Exam  Constitutional: Appears well-developed and well-nourished.  Psych: Affect appropriate to situation Eyes: No scleral injection HENT: No OP obstrucion Head: Normocephalic.  Cardiovascular: Normal rate and regular rhythm.  Respiratory: Effort normal and breath sounds normal to anterior ascultation GI: Soft.  No distension. There is no tenderness.  Skin: WDI  Neuro: Mental Status: Patient is awake, alert, oriented to person, place, month, year, and situation. Patient is able to give a clear and coherent history. No signs of neglect He  has decerased fluency of speech with some stuttering. Also has word finding difficulty and makes some paraphasic errors.  Cranial Nerves: II: Visual Fields are full. Pupils are equal, round, and reactive to light.   III,IV, VI: EOMI without ptosis or diploplia.  V: Facial sensation is symmetric to temperature VII: Facial movement is symmetric.  VIII: hearing is intact to voice X: Uvula elevates symmetrically XI: Shoulder shrug is symmetric. XII: tongue is midline without atrophy or fasciculations.  Motor: Tone is normal. Bulk is normal. 5/5 strength was present in all four extremities.  Sensory: Sensation is symmetric to light touch and temperature in the arms and legs. Cerebellar: FNF intact bilaterally    I have reviewed labs in epic and the results pertinent to this consultation are: cmp - unremarkable  I have reviewed the images obtained: CT head- liekly small acute infarct in the left parietal region  Impression: 49 yo M with likely subcortical infarct. He will need to be evaluated for risk factors as none other than occasional tobaco use are currently known. An MRI could also be helpful.  Recommendations: 1. HgbA1c, fasting lipid panel 2. MRI, MRA  of the brain without contrast(may need sedation) 3. Frequent neuro checks 4. Echocardiogram 5. Carotid dopplers 6. Prophylactic therapy-Antiplatelet med: Aspirin - dose 325mg  PO or 300mg  PR 7. Risk factor modification 8. Telemetry monitoring 9. PT consult, OT consult, Speech consult    Roland Rack, MD [facility] [phone]  If 7pm- 7am, please page neurology on call as listed  in [place].

## 2014-10-30 NOTE — Progress Notes (Signed)
Pt admitted to room 4N12 from ED. Pt is alert and oriented with some anxiety. Wife is at bedside.  Tele applied and safety measures in place. Will continue to monitor.   Fredrich Romans, RN

## 2014-10-31 ENCOUNTER — Encounter (HOSPITAL_COMMUNITY): Payer: Self-pay

## 2014-10-31 ENCOUNTER — Inpatient Hospital Stay (HOSPITAL_COMMUNITY): Payer: Managed Care, Other (non HMO)

## 2014-10-31 DIAGNOSIS — I10 Essential (primary) hypertension: Secondary | ICD-10-CM | POA: Insufficient documentation

## 2014-10-31 DIAGNOSIS — I63412 Cerebral infarction due to embolism of left middle cerebral artery: Principal | ICD-10-CM

## 2014-10-31 DIAGNOSIS — Z720 Tobacco use: Secondary | ICD-10-CM

## 2014-10-31 DIAGNOSIS — I639 Cerebral infarction, unspecified: Secondary | ICD-10-CM

## 2014-10-31 DIAGNOSIS — F1210 Cannabis abuse, uncomplicated: Secondary | ICD-10-CM

## 2014-10-31 DIAGNOSIS — E785 Hyperlipidemia, unspecified: Secondary | ICD-10-CM | POA: Insufficient documentation

## 2014-10-31 LAB — HEMOGLOBIN A1C
HEMOGLOBIN A1C: 5.8 % — AB (ref 4.8–5.6)
Mean Plasma Glucose: 120 mg/dL

## 2014-10-31 MED ORDER — IOHEXOL 350 MG/ML SOLN
80.0000 mL | Freq: Once | INTRAVENOUS | Status: AC | PRN
  Administered 2014-10-31: 80 mL via INTRAVENOUS

## 2014-10-31 NOTE — Progress Notes (Signed)
    [facility] has been requested to perform a transesophageal echocardiogram on Mr. Brent, [name] for stroke evaluation.  After careful review of history and examination, the risks and benefits of transesophageal echocardiogram have been explained including risks of esophageal damage, perforation (1:10,000 risk), bleeding, pharyngeal hematoma as well as other potential complications associated with conscious sedation including aspiration, arrhythmia, respiratory failure and death. Alternatives to treatment were discussed, questions were answered. Patient is willing to proceed.  TEE - Dr. Johnsie Cancel @1300 .  Leanor Kail, PA-C 10/31/2014 3:39 PM

## 2014-10-31 NOTE — Progress Notes (Signed)
[facility] PROGRESS NOTE  [name] ZOX:[id number] DOB: 06-Jun-1966 DOA: 10/29/2014 PCP: [name], PA-C  Assessment/Plan: Active Problems:   Embolic stroke/CVA (cerebral infarction) - Neurology on board and currently assisting - Workup underway final recommendations to come from neurologist once workup complete - Routine stroke order set placed - MRI obtained and reporting acute nonhemorrhagic left MCA infarct involving the posterior left sylvian fissure and lateral left temporal lobe - Awaiting further workup and recommendations from neurology  Chest pain - Resolved with protonix administration. - Troponins 3 negative  Code Status: full Family Communication: d/c Disposition Plan: Pending further workup and recommendations by neurology   Consultants:  Neurology  Procedures:  Please refer to EMR  Antibiotics:  None  HPI/Subjective: Patient has no new complaints. States that his reflux is much improved on protonix  Objective: Filed Vitals:   10/31/14 1407  BP: 121/69  Pulse: 88  Temp: 98.8 F (37.1 C)  Resp: 16   No intake or output data in the 24 hours ending 10/31/14 1600 Filed Weights   10/30/14 0100  Weight: 104.6 kg (230 lb 9.6 oz)    Exam:   General:  Patient in no acute distress, alert and awake  Cardiovascular: Regular rate and rhythm, no murmurs or rubs  Respiratory: clear to auscultation bilaterally, no wheezes  Abdomen: Soft, nondistended, nontender  Musculoskeletal: No cyanosis or clubbing   Data Reviewed: Basic Metabolic Panel:  Recent Labs Lab 10/29/14 1711 10/30/14 0130  NA 137  --   K 3.9  --   CL 104  --   CO2 23  --   GLUCOSE 81  --   BUN 10  --   CREATININE 1.02 1.08  CALCIUM 8.8  --    Liver Function Tests:  Recent Labs Lab 10/29/14 1711  AST 20  ALT 36  ALKPHOS 47  BILITOT 0.5  PROT 6.3  ALBUMIN 3.6   No results for input(s): LIPASE, AMYLASE in the last 168 hours. No results for  input(s): AMMONIA in the last 168 hours. CBC:  Recent Labs Lab 10/29/14 1711 10/30/14 0130  WBC 9.3 9.8  NEUTROABS 5.6  --   HGB 13.2 13.3  HCT 39.7 41.0  MCV 89.4 90.5  PLT 236 247   Cardiac Enzymes:  Recent Labs Lab 10/30/14 0130 10/30/14 0530 10/30/14 1137  TROPONINI <0.03 <0.03 <0.03   BNP (last 3 results) No results for input(s): BNP in the last 8760 hours.  ProBNP (last 3 results) No results for input(s): PROBNP in the last 8760 hours.  CBG:  Recent Labs Lab 10/29/14 1459  GLUCAP 125*    No results found for this or any previous visit (from the past 240 hour(s)).   Studies: Ct Angio Head W/cm &/or Wo Cm  10/31/2014   CLINICAL DATA:  Acute LEFT brain stroke. Continued evaluation for etiology. Initial encounter. Stroke risk factors include smoking, THC use, obesity, hyperlipidemia, and obstructive sleep apnea.  EXAM: CT ANGIOGRAPHY HEAD AND NECK  TECHNIQUE: Multidetector CT imaging of the head and neck was performed using the standard protocol during bolus administration of intravenous contrast. Multiplanar CT image reconstructions and MIPs were obtained to evaluate the vascular anatomy. Carotid stenosis measurements (when applicable) are obtained utilizing NASCET criteria, using the distal internal carotid diameter as the denominator.  CONTRAST:  38mL OMNIPAQUE IOHEXOL 350 MG/ML SOLN  COMPARISON:  CT head without contrast 10/29/2014. MR head without contrast 10/30/2014  FINDINGS: CT HEAD  Calvarium and skull base: No fracture or destructive  lesion. Mastoids and middle ears are grossly clear.  Paranasal sinuses: Imaged portions are clear.  Orbits: Negative.  Brain: Slight premature for age atrophy. Evolving cytotoxic edema LEFT insula, LEFT lateral temporal lobe, and subcortical white matter. No hemorrhage.  CTA NECK  Aortic arch: Standard branching. Imaged portion shows no evidence of aneurysm or dissection. No significant stenosis of the major arch vessel origins.   Right carotid system: No evidence of dissection, stenosis (50% or greater) or occlusion.  Left carotid system: No evidence of dissection, stenosis (50% or greater) or occlusion.  Vertebral arteries: LEFT vertebral slightly larger. Both are patent and contribute to basilar formation. No evidence of dissection, stenosis (50% or greater) or occlusion.  Nonvascular soft tissues:  Unremarkable.  CTA HEAD  Anterior circulation: No significant stenosis, proximal occlusion, aneurysm, or vascular malformation.  Posterior circulation: No significant stenosis, proximal occlusion, aneurysm, or vascular malformation.  Venous sinuses: As permitted by contrast timing, patent.  Anatomic variants: None of significance.  Delayed phase:   No abnormal intracranial enhancement.  IMPRESSION: Evolving cytotoxic edema related to LEFT MCA territory infarcts of the insula and temporal lobe.  No extracranial source of atherosclerotic emboli. Specifically, LEFT carotid bifurcation shows no significant atheromatous disease. No dissection or FMD related to the LEFT ICA.  No significant atheromatous disease of the intracranial ICA segments or proximal LEFT middle cerebral artery.   Electronically Signed   By: Rolla Flatten M.D.   On: 10/31/2014 09:53   Dg Chest 2 View  10/29/2014   CLINICAL DATA:  Acute left chest pain since earlier today radiating to the left arm  EXAM: CHEST  2 VIEW  COMPARISON:  09/16/2006  FINDINGS: The heart size and mediastinal contours are within normal limits. Both lungs are clear. The visualized skeletal structures are unremarkable.  IMPRESSION: No active cardiopulmonary disease.   Electronically Signed   By: Jerilynn Mages.  Shick M.D.   On: 10/29/2014 18:50   Ct Head Wo Contrast  10/29/2014   CLINICAL DATA:  Stuttering.  Left-sided chest pain.  EXAM: CT HEAD WITHOUT CONTRAST  TECHNIQUE: Contiguous axial images were obtained from the base of the skull through the vertex without intravenous contrast.  COMPARISON:  09/16/2006.   FINDINGS: There is a focal area of low attenuation within the left parietal white matter, image 17/series 2. This measures approximately 1.3 cm and is new when compared with 2008. The right cerebral hemisphere and both cerebellar hemispheres appear normal. Mild prominence of the sulci. The ventricular volumes are unremarkable. There is no evidence for intracranial hemorrhage or mass. There is mucosal thickening involving the ethmoid air cells and sphenoid sinus. The mastoid air cells are clear. The calvarium is intact.  IMPRESSION: 1. Focal area of low attenuation in the left subcortical parietal lobe is new from previous head CT. This is favored to represent either a subacute or chronic infarct.   Electronically Signed   By: Kerby Moors M.D.   On: 10/29/2014 21:32   Ct Angio Neck W/cm &/or Wo/cm  10/31/2014   CLINICAL DATA:  Acute LEFT brain stroke. Continued evaluation for etiology. Initial encounter. Stroke risk factors include smoking, THC use, obesity, hyperlipidemia, and obstructive sleep apnea.  EXAM: CT ANGIOGRAPHY HEAD AND NECK  TECHNIQUE: Multidetector CT imaging of the head and neck was performed using the standard protocol during bolus administration of intravenous contrast. Multiplanar CT image reconstructions and MIPs were obtained to evaluate the vascular anatomy. Carotid stenosis measurements (when applicable) are obtained utilizing NASCET criteria, using the distal  internal carotid diameter as the denominator.  CONTRAST:  29mL OMNIPAQUE IOHEXOL 350 MG/ML SOLN  COMPARISON:  CT head without contrast 10/29/2014. MR head without contrast 10/30/2014  FINDINGS: CT HEAD  Calvarium and skull base: No fracture or destructive lesion. Mastoids and middle ears are grossly clear.  Paranasal sinuses: Imaged portions are clear.  Orbits: Negative.  Brain: Slight premature for age atrophy. Evolving cytotoxic edema LEFT insula, LEFT lateral temporal lobe, and subcortical white matter. No hemorrhage.  CTA NECK   Aortic arch: Standard branching. Imaged portion shows no evidence of aneurysm or dissection. No significant stenosis of the major arch vessel origins.  Right carotid system: No evidence of dissection, stenosis (50% or greater) or occlusion.  Left carotid system: No evidence of dissection, stenosis (50% or greater) or occlusion.  Vertebral arteries: LEFT vertebral slightly larger. Both are patent and contribute to basilar formation. No evidence of dissection, stenosis (50% or greater) or occlusion.  Nonvascular soft tissues:  Unremarkable.  CTA HEAD  Anterior circulation: No significant stenosis, proximal occlusion, aneurysm, or vascular malformation.  Posterior circulation: No significant stenosis, proximal occlusion, aneurysm, or vascular malformation.  Venous sinuses: As permitted by contrast timing, patent.  Anatomic variants: None of significance.  Delayed phase:   No abnormal intracranial enhancement.  IMPRESSION: Evolving cytotoxic edema related to LEFT MCA territory infarcts of the insula and temporal lobe.  No extracranial source of atherosclerotic emboli. Specifically, LEFT carotid bifurcation shows no significant atheromatous disease. No dissection or FMD related to the LEFT ICA.  No significant atheromatous disease of the intracranial ICA segments or proximal LEFT middle cerebral artery.   Electronically Signed   By: Rolla Flatten M.D.   On: 10/31/2014 09:53   Mr Jodene Nam Head/brain Wo Cm  10/30/2014   CLINICAL DATA:  Difficulty speaking beginning 3 days ago.  EXAM: MRA HEAD WITHOUT CONTRAST  TECHNIQUE: Angiographic images of the Circle of Willis were obtained using MRA technique without intravenous contrast.  COMPARISON:  CT HEAD WITHOUT CONTRAST 10/29/2014  FINDINGS: The diffusion weighted images confirm an acute nonhemorrhagic infarct along the posterior left sylvian fissure and lateral left temporal lobe. There is no hemorrhage or mass lesion. Ventricles are of normal size. No significant extra-axial  fluid collection is present.  IMPRESSION: 1. Acute nonhemorrhagic left MCA infarct involving the posterior left sylvian fissure and lateral left temporal lobe. 2. No acute hemorrhage.   Electronically Signed   By: San Morelle M.D.   On: 10/30/2014 17:11    Scheduled Meds: .  stroke: mapping our early stages of recovery book   Does not apply Once  . asenapine  5 mg Sublingual Daily  . aspirin  300 mg Rectal Daily   Or  . aspirin  325 mg Oral Daily  . atorvastatin  40 mg Oral q1800  . benztropine  1 mg Oral BID  . divalproex  1,000 mg Oral BID  . enoxaparin (LOVENOX) injection  40 mg Subcutaneous Q24H  . ezetimibe  10 mg Oral Daily  . folic acid  1 mg Oral Daily  . LORazepam  1 mg Intravenous Once  . LORazepam  0-4 mg Oral 4 times per day  . LORazepam  0-4 mg Oral Q12H  . multivitamin with minerals  1 tablet Oral Daily  . nicotine  21 mg Transdermal Daily  . pantoprazole  40 mg Oral Daily  . thiamine  100 mg Oral Daily   Continuous Infusions:    Time spent: > 35 minutes    Marabelle Cushman,  [facility]  [facility] Pager [phone]. If 7PM-7AM, please contact night-coverage at [email or web address], password [facility] 10/31/2014, 4:00 PM  LOS: 2 days

## 2014-10-31 NOTE — Progress Notes (Signed)
STROKE TEAM PROGRESS NOTE   HISTORY [name] is a 49 y.o. male who noticed that he began having some difficulty speaking last Sunday. He reports that the symptoms have been persistent since that time. He has some difficulty understanding, but not much. He does have difficulty with word finding, and occasionally misspeaks. He deneis weakness, numbness, visual changes, difficulty walking or other symtpoms. He had chest pain earlier today that is resolved at this time. He described it as "like someone punched me in the left side of my chest." Patient was not administered TPA secondary to delay in arrival. He was admitted for further evaluation and treatment.   SUBJECTIVE (INTERVAL HISTORY) Wife at bedside. Both agreeable to plan and thankful for workup. Less agitated than yesterday.   OBJECTIVE Temp:  [97.5 F (36.4 C)-98.2 F (36.8 C)] 97.7 F (36.5 C) (04/28 0934) Pulse Rate:  [68-85] 77 (04/28 0934) Cardiac Rhythm:  [-] Normal sinus rhythm (04/27 2012) Resp:  [16-18] 16 (04/28 0934) BP: (100-137)/(58-81) 121/64 mmHg (04/28 0934) SpO2:  [94 %-100 %] 94 % (04/28 0934)   Recent Labs Lab 10/29/14 1459  GLUCAP 125*    Recent Labs Lab 10/29/14 1711 10/30/14 0130  NA 137  --   K 3.9  --   CL 104  --   CO2 23  --   GLUCOSE 81  --   BUN 10  --   CREATININE 1.02 1.08  CALCIUM 8.8  --     Recent Labs Lab 10/29/14 1711  AST 20  ALT 36  ALKPHOS 47  BILITOT 0.5  PROT 6.3  ALBUMIN 3.6    Recent Labs Lab 10/29/14 1711 10/30/14 0130  WBC 9.3 9.8  NEUTROABS 5.6  --   HGB 13.2 13.3  HCT 39.7 41.0  MCV 89.4 90.5  PLT 236 247    Recent Labs Lab 10/30/14 0130 10/30/14 0530 10/30/14 1137  TROPONINI <0.03 <0.03 <0.03   No results for input(s): LABPROT, INR in the last 72 hours. No results for input(s): COLORURINE, LABSPEC, [place], GLUCOSEU, HGBUR, BILIRUBINUR, KETONESUR, PROTEINUR, UROBILINOGEN, NITRITE, LEUKOCYTESUR in the last 72 hours.  Invalid input(s):  APPERANCEUR     Component Value Date/Time   CHOL 216* 10/30/2014 0530   TRIG 450* 10/30/2014 0530   HDL 23* 10/30/2014 0530   CHOLHDL 9.4 10/30/2014 0530   VLDL UNABLE TO CALCULATE IF TRIGLYCERIDE OVER 400 mg/dL 10/30/2014 0530   LDLCALC UNABLE TO CALCULATE IF TRIGLYCERIDE OVER 400 mg/dL 10/30/2014 0530   Lab Results  Component Value Date   HGBA1C 5.8* 10/30/2014      Component Value Date/Time   LABOPIA NONE DETECTED 10/30/2014 0155   COCAINSCRNUR NONE DETECTED 10/30/2014 0155   LABBENZ POSITIVE* 10/30/2014 0155   AMPHETMU NONE DETECTED 10/30/2014 0155   THCU POSITIVE* 10/30/2014 0155   LABBARB NONE DETECTED 10/30/2014 0155     Recent Labs Lab 10/30/14 0130  ETH <5   I have personally reviewed the radiological images below and agree with the radiology interpretations.  Dg Chest 2 View 10/29/2014    No active cardiopulmonary disease.   Ct Head Wo Contrast 10/29/2014   1. Focal area of low attenuation in the left subcortical parietal lobe is new from previous head CT. This is favored to represent either a subacute or chronic infarct.    MRI Head  10/30/2014    Acute nonhemorrhagic left MCA infarct involving the posterior left sylvian fissure and lateral left temporal lobe. 2. No acute hemorrhage.   2D Echocardiogram   -  Left ventricle: The cavity size was normal. Wall thickness wasincreased in a pattern of mild LVH. The estimated ejectionfraction was 60%. Wall motion was normal; there were no regionalwall motion abnormalities. - Left atrium: The atrium was mildly dilated. - Right ventricle: The cavity size was normal. Systolic functionwas normal. Impressions: No cardiac source of embolism was identified, butcannot be ruled out on the basis of this examination.  CTA head and neck  10/21/2014   Evolving cytotoxic edema related to LEFT MCA territory infarcts of the insula and temporal lobe. No extracranial source of atherosclerotic emboli. Specifically, LEFT carotid  bifurcation shows no significant atheromatous disease. No dissection or FMD related to the LEFT ICA. No significant atheromatous disease of the intracranial ICA segments or proximal LEFT middle cerebral artery.   PHYSICAL EXAM  Temp:  [97.5 F (36.4 C)-98.2 F (36.8 C)] 97.7 F (36.5 C) (04/28 0934) Pulse Rate:  [68-85] 77 (04/28 0934) Resp:  [16-18] 16 (04/28 0934) BP: (100-137)/(58-81) 121/64 mmHg (04/28 0934) SpO2:  [94 %-100 %] 94 % (04/28 0934)  General - obesity, well developed, in no apparent distress.  Ophthalmologic - Sharp disc margins OU.  Cardiovascular - Regular rate and rhythm with no murmur.  Mental Status -  Level of arousal and orientation to time, place, and person were intact. Language including expression, naming, comprehension was assessed and found intact, but mild repetition difficulty. Attention span and concentration were normal. Recent and remote memory were intact. Fund of Knowledge was assessed and was intact.  Cranial Nerves II - XII - II - Visual field intact OU. III, IV, VI - Extraocular movements intact. V - Facial sensation intact bilaterally. VII - Facial movement intact bilaterally. VIII - Hearing & vestibular intact bilaterally. X - Palate elevates symmetrically. XI - Chin turning & shoulder shrug intact bilaterally. XII - Tongue protrusion intact.  Motor Strength - The patient's strength was normal in all extremities and pronator drift was absent.  Bulk was normal and fasciculations were absent.   Motor Tone - Muscle tone was assessed at the neck and appendages and was normal.  Reflexes - The patient's reflexes were 1+ in all extremities and he had no pathological reflexes.  Sensory - Light touch, temperature/pinprick were assessed and were symmetrical.    Coordination - The patient had normal movements in the hands and feet with no ataxia or dysmetria.  Tremor was absent.  Gait and Station - The patient's transfers, posture, gait,  station, and turns were observed as normal.   ASSESSMENT/PLAN Mr. [name] is a 49 y.o. male with history of depression, ETOH abuse, smoker, myocarditis as young presenting with difficulty speaking x 5 days and chest pain x 1 day. He did not receive IV t-PA due to delay in arrival.   Stroke:  left MCA infarct, embolic pattern, secondary to unknown source, workup underway  Resultant  Mild repetition difficulty  MRI  L MCA infarcts - posterior left sylvian fissure and lateral left temporal lobe  CTA head and neck showed no related stenosis noted  2D Echo  No source of embolus   LDL unable to calculate, due to high TG  HgbA1c 5.8, WNL  TEE to look for embolic source. Arranged with [facility] for tomorrow. (I have made patient NPO after midnight tonight).   LE venous doppler pending  If TEE negative, a [facility] electrophysiologist will consult and consider placement of an implantable loop recorder to evaluate for atrial fibrillation as etiology  of stroke. This has been explained to patient/family by Dr. Erlinda Hong and they are agreeable.   Lovenox 40 mg sq daily for VTE prophylaxis   no antithrombotic prior to admission, now on aspirin 325 mg orally every day  Patient counseled to be compliant with his antithrombotic medications  Ongoing aggressive stroke risk factor management  Therapy recommendations:  No PT or OT, HH SLP  Disposition:  Home with SLP  Hyperlipidemia  Home meds:  No statin   LDL unable to calculate, goal < 70  Added lipitor 40 and zetia 10mg   Continue statin and zetia at discharge  Tobacco abuse  Current smoker  Smoking cessation counseling provided  Nicotine patch provided  Pt is willing to quit  Obstructive Sleep Apnea   Undiagnosed  Symptoms consistent with OSA as per wife  Will need OP sleep study  Other Stroke Risk Factors  ETOH use - 12-18 beers daily, has been drinking since the  age of 45. On CIWA protocol.    UDS positive for THC, which is a stroke risk factor  Obesity, Body mass index is 37.24 kg/(m^2).   Likely undiagnosed Obstructive sleep apnea  Other Active Problems  Chest pain, felt to be reflux. Troponin's neg. Not taking his medication d/t cost.   Low back pain  Depression/anxiety/"anger" issues, on depakote per pt  Other Pertinent History  Congenital heart disease at 77mo of age, on digoxin until age 32/12. Followed at [facility] until the age of 20.   Hx RLE "clot" at 11 mo in the setting of CHF at time of "mycarditis" in 1965 per mother  Hospital day # [place] for Pager information 10/31/2014 10:17 AM   I, the attending vascular neurologist, have personally obtained a history, examined the patient, evaluated laboratory data, individually viewed imaging studies and agree with radiology interpretations. Together with the NP/PA, we formulated the assessment and plan of care which reflects our mutual decision.  I have made any additions or clarifications directly to the above note and agree with the findings and plan as currently documented.   49 yo M with mulitple stroke risk factors including anxiety, depression, smoker, alcohol use admitted for left MCA stroke. Stroke consistent with embolic stroke, CTA head and neck negative. Found to have high TG and LDL, put on lipitor and zetia. Likely to have OSA but need OP sleep study. Need to quit smoking and alcohol. Regular exercise and healthy diet. Due to embolic pattern and hx of heart disease, will need TEE and loop.  Rosalin Hawking, MD PhD Stroke Neurology 10/31/2014 9:40 PM       To contact Stroke Continuity provider, please refer to [email or web address] After hours, contact General Neurology

## 2014-10-31 NOTE — Interval H&P Note (Signed)
History and Physical Interval Note:  10/31/2014 3:59 PM  [name]  has presented today for surgery, with the diagnosis of STROKE  The various methods of treatment have been discussed with the patient and family. After consideration of risks, benefits and other options for treatment, the patient has consented to  Procedure(s): TRANSESOPHAGEAL ECHOCARDIOGRAM (TEE) (N/A) as a surgical intervention .  The patient's history has been reviewed, patient examined, no change in status, stable for surgery.  I have reviewed the patient's chart and labs.  Questions were answered to the patient's satisfaction.     Jenkins Rouge

## 2014-11-01 ENCOUNTER — Encounter (HOSPITAL_COMMUNITY)
Admission: EM | Disposition: A | Discharge status: Home Health | Payer: Self-pay | Source: Home / Self Care | Attending: Family Medicine

## 2014-11-01 ENCOUNTER — Telehealth: Payer: Self-pay | Admitting: Medical

## 2014-11-01 DIAGNOSIS — I10 Essential (primary) hypertension: Secondary | ICD-10-CM

## 2014-11-01 DIAGNOSIS — R079 Chest pain, unspecified: Secondary | ICD-10-CM

## 2014-11-01 DIAGNOSIS — I639 Cerebral infarction, unspecified: Secondary | ICD-10-CM

## 2014-11-01 HISTORY — PX: TEE WITHOUT CARDIOVERSION: SHX5443

## 2014-11-01 HISTORY — PX: LOOP RECORDER IMPLANT: SHX5477

## 2014-11-01 SURGERY — ECHOCARDIOGRAM, TRANSESOPHAGEAL
Anesthesia: Moderate Sedation

## 2014-11-01 SURGERY — LOOP RECORDER IMPLANT
Anesthesia: LOCAL

## 2014-11-01 MED ORDER — MIDAZOLAM HCL 5 MG/ML IJ SOLN
INTRAMUSCULAR | Status: AC
  Filled 2014-11-01: qty 3

## 2014-11-01 MED ORDER — DIPHENHYDRAMINE HCL 50 MG/ML IJ SOLN
INTRAMUSCULAR | Status: DC | PRN
  Administered 2014-11-01: 50 mg via INTRAVENOUS

## 2014-11-01 MED ORDER — ATORVASTATIN CALCIUM 40 MG PO TABS: 40.0000 mg | ORAL_TABLET | Freq: Every day | ORAL | Status: DC

## 2014-11-01 MED ORDER — FENTANYL CITRATE (PF) 100 MCG/2ML IJ SOLN
INTRAMUSCULAR | Status: DC | PRN
  Administered 2014-11-01 (×3): 25 ug via INTRAVENOUS

## 2014-11-01 MED ORDER — FENOFIBRATE 54 MG PO TABS: 54.0000 mg | ORAL_TABLET | Freq: Every day | ORAL | Status: DC

## 2014-11-01 MED ORDER — DIPHENHYDRAMINE HCL 50 MG/ML IJ SOLN
INTRAMUSCULAR | Status: AC
  Filled 2014-11-01: qty 1

## 2014-11-01 MED ORDER — ASPIRIN 325 MG PO TABS: 325.0000 mg | ORAL_TABLET | Freq: Every day | ORAL | Status: DC

## 2014-11-01 MED ORDER — SODIUM CHLORIDE 0.9 % IV SOLN: INTRAVENOUS | Status: DC

## 2014-11-01 MED ORDER — PANTOPRAZOLE SODIUM 40 MG PO TBEC: 40.0000 mg | DELAYED_RELEASE_TABLET | Freq: Every day | ORAL | Status: DC

## 2014-11-01 MED ORDER — FENOFIBRATE 54 MG PO TABS
54.0000 mg | ORAL_TABLET | Freq: Every day | ORAL | Status: DC
Start: 2014-11-01 — End: 2014-11-01
  Filled 2014-11-01: qty 1

## 2014-11-01 MED ORDER — FENTANYL CITRATE (PF) 100 MCG/2ML IJ SOLN
INTRAMUSCULAR | Status: AC
  Filled 2014-11-01: qty 4

## 2014-11-01 MED ORDER — MIDAZOLAM HCL 10 MG/2ML IJ SOLN
INTRAMUSCULAR | Status: DC | PRN
  Administered 2014-11-01 (×4): 2 mg via INTRAVENOUS

## 2014-11-01 NOTE — Discharge Instructions (Signed)
STROKE/TIA DISCHARGE INSTRUCTIONS SMOKING Cigarette smoking nearly doubles your risk of having a stroke & is the single most alterable risk factor  If you smoke or have smoked in the last 12 months, you are advised to quit smoking for your health.  Most of the excess cardiovascular risk related to smoking disappears within a year of stopping.  Ask you doctor about anti-smoking medications  [place] Quit Line: 1-800-QUIT NOW  Free Smoking Cessation Classes [phone]  CHOLESTEROL Know your levels; limit fat & cholesterol in your diet  Lipid Panel     Component Value Date/Time   CHOL 216* 10/30/2014 0530   TRIG 450* 10/30/2014 0530   HDL 23* 10/30/2014 0530   CHOLHDL 9.4 10/30/2014 0530   VLDL UNABLE TO CALCULATE IF TRIGLYCERIDE OVER 400 mg/dL 10/30/2014 0530   LDLCALC UNABLE TO CALCULATE IF TRIGLYCERIDE OVER 400 mg/dL 10/30/2014 0530      Many patients benefit from treatment even if their cholesterol is at goal.  Goal: Total Cholesterol (CHOL) less than 160  Goal:  Triglycerides (TRIG) less than 150  Goal:  HDL greater than 40  Goal:  LDL (LDLCALC) less than 100   BLOOD PRESSURE American Stroke Association blood pressure target is less that 120/80 mm/Hg  Your discharge blood pressure is:  BP: 135/83 mmHg  Monitor your blood pressure  Limit your salt and alcohol intake  Many individuals will require more than one medication for high blood pressure  DIABETES (A1c is a blood sugar average for last 3 months) Goal HGBA1c is under 7% (HBGA1c is blood sugar average for last 3 months)  Diabetes: No known diagnosis of diabetes    Lab Results  Component Value Date   HGBA1C 5.8* 10/30/2014     Your HGBA1c can be lowered with medications, healthy diet, and exercise.  Check your blood sugar as directed by your physician  Call your physician if you experience unexplained or low blood sugars.  PHYSICAL ACTIVITY/REHABILITATION Goal is 30 minutes at least 4 days per week  Activity:  Increase activity slowly, Therapies: Speech OP   Activity decreases your risk of heart attack and stroke and makes your heart stronger.  It helps control your weight and blood pressure; helps you relax and can improve your mood.  Participate in a regular exercise program.  Talk with your doctor about the best form of exercise for you (dancing, walking, swimming, cycling).  DIET/WEIGHT Goal is to maintain a healthy weight  Your discharge diet is: Diet NPO time specified Except for: Sips with Meds Diet - low sodium heart healthy , thin liquids Your height is:  Height: 5\' 6"  (167.6 cm) Your current weight is: Weight: 104.6 kg (230 lb 9.6 oz) Your Body Mass Index (BMI) is:  BMI (Calculated): 37.3  Following the type of diet specifically designed for you will help prevent another stroke.  Your goal Body Mass Index (BMI) is 19-24.  Healthy food habits can help reduce 3 risk factors for stroke:  High cholesterol, hypertension, and excess weight.  RESOURCES Stroke/Support Group:  Call [phone]   STROKE EDUCATION PROVIDED/REVIEWED AND GIVEN TO PATIENT Stroke warning signs and symptoms How to activate emergency medical system (call 911). Medications prescribed at discharge. Need for follow-up after discharge. Personal risk factors for stroke. Pneumonia vaccine given: No Flu vaccine given: No My questions have been answered, the writing is legible, and I understand these instructions.  I will adhere to these goals & educational materials that have been provided to me after my  discharge from the hospital.    Cardiac Event Monitoring A cardiac event monitor is a small recording device used to help detect abnormal heart rhythms (arrhythmias). The monitor is used to record heart rhythm when noticeable symptoms such as the following occur:  Fast heartbeats (palpitations), such as heart racing or fluttering.  Dizziness.  Fainting or light-headedness.  Unexplained weakness. The monitor is  wired to two electrodes placed on your chest. Electrodes are flat, sticky disks that attach to your skin. The monitor can be worn for up to 30 days. You will wear the monitor at all times, except when bathing.  HOW TO USE YOUR CARDIAC EVENT MONITOR A technician will prepare your chest for the electrode placement. The technician will show you how to place the electrodes, how to work the monitor, and how to replace the batteries. Take time to practice using the monitor before you leave the office. Make sure you understand how to send the information from the monitor to your health care provider. This requires a telephone with a landline, not a cell phone. You need to:  Wear your monitor at all times, except when you are in water:  Do not get the monitor wet.  Take the monitor off when bathing. Do not swim or use a hot tub with it on.  Keep your skin clean. Do not put body lotion or moisturizer on your chest.  Change the electrodes daily or any time they stop sticking to your skin. You might need to use tape to keep them on.  It is possible that your skin under the electrodes could become irritated. To keep this from happening, try to put the electrodes in slightly different places on your chest. However, they must remain in the area under your left breast and in the upper right section of your chest.  Make sure the monitor is safely clipped to your clothing or in a location close to your body that your health care provider recommends.  Press the button to record when you feel symptoms of heart trouble, such as dizziness, weakness, light-headedness, palpitations, thumping, shortness of breath, unexplained weakness, or a fluttering or racing heart. The monitor is always on and records what happened slightly before you pressed the button, so do not worry about being too late to get good information.  Keep a diary of your activities, such as walking, doing chores, and taking medicine. It is especially  important to note what you were doing when you pushed the button to record your symptoms. This will help your health care provider determine what might be contributing to your symptoms. The information stored in your monitor will be reviewed by your health care provider alongside your diary entries.  Send the recorded information as recommended by your health care provider. It is important to understand that it will take some time for your health care provider to process the results.  Change the batteries as recommended by your health care provider. SEEK IMMEDIATE MEDICAL CARE IF:   You have chest pain.  You have extreme difficulty breathing or shortness of breath.  You develop a very fast heartbeat that persists.  You develop dizziness that does not go away.  You faint or constantly feel you are about to faint. Document Released: 03/30/2008 Document Revised: 11/05/2013 Document Reviewed: 12/18/2012 [facility] Patient Information 2015 [place], [place]. This information is not intended to replace advice given to you by your health care provider. Make sure you discuss any questions you have with your health  care provider.  Transesophageal Echocardiogram Transesophageal echocardiography (TEE) is a special type of test that produces images of the heart by using sound waves (echocardiogram). This type of echocardiography can obtain better images of the heart than standard echocardiography. TEE is done by passing a flexible tube down the esophagus. The heart is located in front of the esophagus. Because the heart and esophagus are close to one another, your health care provider can take very clear, detailed pictures of the heart via ultrasound waves. TEE may be done:  If your health care provider needs more information based on standard echocardiography findings.  If you had a stroke. This might have happened because a clot formed in your heart. TEE can visualize different areas of the heart and  check for clots.  To check valve anatomy and function.  To check for infection on the inside of your heart (endocarditis).  To evaluate the dividing wall (septum) of the heart and presence of a hole that did not close after birth (patent foramen ovale or atrial septal defect).  To help diagnose a tear in the wall of the aorta (aortic dissection).  During cardiac valve surgery. This allows the surgeon to assess the valve repair before closing the chest.  During a variety of other cardiac procedures to guide positioning of catheters.  Sometimes before a cardioversion, which is a shock to convert heart rhythm back to normal. LET [facility] CARE PROVIDER KNOW ABOUT:   Any allergies you have.  All medicines you are taking, including vitamins, herbs, eye drops, creams, and over-the-counter medicines.  Previous problems you or members of your family have had with the use of anesthetics.  Any blood disorders you have.  Previous surgeries you have had.  Medical conditions you have.  Swallowing difficulties.  An esophageal obstruction. RISKS AND COMPLICATIONS  Generally, TEE is a safe procedure. However, as with any procedure, complications can occur. Possible complications include an esophageal tear (rupture). BEFORE THE PROCEDURE   Do not eat or drink for 6 hours before the procedure or as directed by your health care provider.  Arrange for someone to drive you home after the procedure. Do not drive yourself home. During the procedure, you will be given medicines that can continue to make you feel drowsy and can impair your reflexes.  An IV access tube will be started in the arm. PROCEDURE   A medicine to help you relax (sedative) will be given through the IV access tube.  A medicine may be sprayed or gargled to numb the back of the throat.  Your blood pressure, heart rate, and breathing (vital signs) will be monitored during the procedure.  The TEE probe is a long, flexible  tube. The tip of the probe is placed into the back of the mouth, and you will be asked to swallow. This helps to pass the tip of the probe into the esophagus. Once the tip of the probe is in the correct area, your health care provider can take pictures of the heart.  TEE is usually not a painful procedure. You may feel the probe press against the back of the throat. The probe does not enter the trachea and does not affect your breathing. AFTER THE PROCEDURE   You will be in bed, resting, until you have fully returned to consciousness.  When you first awaken, your throat may feel slightly sore and will probably still feel numb. This will improve slowly over time.  You will not be allowed to eat  or drink until it is clear that the numbness has improved.  Once you have been able to drink, urinate, and sit on the edge of the bed without feeling sick to your stomach (nausea) or dizzy, you may be cleared to go home.  You should have a friend or family member with you for the next 24 hours after your procedure. Document Released: 09/11/2002 Document Revised: 06/26/2013 Document Reviewed: 12/21/2012 [facility] Patient Information 2015 [place], [place]. This information is not intended to replace advice given to you by your health care provider. Make sure you discuss any questions you have with your health care provider.

## 2014-11-01 NOTE — Progress Notes (Signed)
SLP Cancellation Note  Patient Details Name: [name] MRN: [id number] DOB: 06/25/1966   Cancelled treatment:        Attempted x 2 for tx. Pt out of room both times. Will follow along.   Houston Siren 11/01/2014, 2:49 PM  Orbie Pyo Colvin Caroli.Ed [employer] [phone]

## 2014-11-01 NOTE — CV Procedure (Signed)
Pre op Dx Cryptogenic Stroke s Post op Dx same  Procedure  Loop Recorder implantation  After routine prep and drape of the left parasternal area, a small incision was created. A Medtronic LINQ Reveal Loop Recorder  Serial Number  [id number] was inserted.    SteriStrip dressing was  applied.  The patient tolerated the procedure without apparent complication.

## 2014-11-01 NOTE — Telephone Encounter (Signed)
This patient was recently hospitalized.   Please call patient about hospital continuity of care.    See how they are doing. Review any medication changes per discharge summary. Schedule follow up appointment and make note on schedule that this is hospital f/u continuity of care visit.   

## 2014-11-01 NOTE — Consult Note (Signed)
ELECTROPHYSIOLOGY CONSULT NOTE  Patient ID: [name] MRN: [id number], DOB/AGE: 02-12-1966   Admit date: 10/29/2014 Date of Consult: 11/01/2014  Primary Physician: [name], PA-C Primary Cardiologist: new to [facility] Reason for Consultation: Cryptogenic stroke; recommendations regarding Implantable Loop Recorder  History of Present Illness [name] was admitted on 10/29/2014 with expressive aphasia.  Imaging demonstrated left MCA infarct in an embolic pattern.  He has undergone workup for stroke including echocardiogram and carotid angiography.  The patient has been monitored on telemetry which has demonstrated sinus rhythm with no arrhythmias.  Inpatient stroke work-up is to be completed with a TEE.   Echocardiogram this admission demonstrated EF 60%, no RWMA.  Lab work is reviewed and notable for triglycerides of 450, UDS positive for THC, benzodiazepines.   Prior to admission, the patient denies chest pain, shortness of breath, dizziness, palpitations, or syncope.  They are recovering from their stroke with plans to return home with speech therapy at discharge.  He has a history of congenital heart disease (details unclear) and was previously followed at [facility].  He was previously on Digoxin until age 49 or 49.   EP has been asked to evaluate for placement of an implantable loop recorder to monitor for atrial fibrillation.  ROS is negative except as outlined above.    Past Medical History  Diagnosis Date  . Depression   . Congenital heart disease   . Back pain   . History of ETOH abuse   . CHF (congestive heart failure)   . Essential hypertension      Surgical History:  Past Surgical History  Procedure Laterality Date  . Cardiac catheterization      at age 49  . Back surgery Bilateral 1996  . Skin graft    . Carpal tunnel release       Prescriptions prior to admission  Medication Sig Dispense Refill Last Dose  . Asenapine Maleate (SAPHRIS  SL) Place 1 tablet under the tongue daily.   Past Week at Unknown time  . benztropine (COGENTIN) 1 MG tablet Take 1 mg by mouth 2 (two) times daily.   Past Week at Unknown time  . diazepam (VALIUM) 5 MG tablet Take 5 mg by mouth every 12 (twelve) hours as needed for anxiety.   Past Week at Unknown time  . divalproex (DEPAKOTE ER) 500 MG 24 hr tablet Take 1,000 mg by mouth 2 (two) times daily.    Past Week at Unknown time  . docusate sodium (COLACE) 100 MG capsule Take 1 capsule (100 mg total) by mouth 2 (two) times daily. (Patient not taking: Reported on 10/29/2014) 30 capsule 0 Not Taking at Unknown time  . hydrocortisone (ANUSOL-HC) 2.5 % rectal cream Place 1 application rectally 2 (two) times daily. (Patient not taking: Reported on 10/29/2014) 30 g 0 Not Taking at Unknown time  . hydrocortisone (ANUSOL-HC) 25 MG suppository Place 1 suppository (25 mg total) rectally 2 (two) times daily. (Patient not taking: Reported on 10/29/2014) 12 suppository 0 Not Taking at Unknown time    Inpatient Medications:  .  stroke: mapping our early stages of recovery book   Does not apply Once  . asenapine  5 mg Sublingual Daily  . aspirin  300 mg Rectal Daily   Or  . aspirin  325 mg Oral Daily  . atorvastatin  40 mg Oral q1800  . benztropine  1 mg Oral BID  . divalproex  1,000 mg Oral BID  . enoxaparin (LOVENOX) injection  40 mg Subcutaneous Q24H  . ezetimibe  10 mg Oral Daily  . folic acid  1 mg Oral Daily  . LORazepam  1 mg Intravenous Once  . LORazepam  0-4 mg Oral Q12H  . multivitamin with minerals  1 tablet Oral Daily  . nicotine  21 mg Transdermal Daily  . pantoprazole  40 mg Oral Daily  . thiamine  100 mg Oral Daily    Allergies:  Allergies  Allergen Reactions  . Codeine Nausea And Vomiting    History   Social History  . Marital Status: Married    Spouse Name: N/A  . Number of Children: N/A  . Years of Education: N/A   Occupational History  . Not on file.   Social History Main  Topics  . Smoking status: Current Every Day Smoker -- 1.00 packs/day    Types: Cigarettes  . Smokeless tobacco: Never Used  . Alcohol Use: Yes     Comment: 24 beers daily and 1 pint of liquor daily  . Drug Use: No  . Sexual Activity: Not on file   Other Topics Concern  . Not on file   Social History Narrative     Family History  Problem Relation Age of Onset  . Cancer Maternal Grandmother   . Cancer Paternal Grandfather     lymphoma     Physical Exam: Filed Vitals:   10/31/14 1732 10/31/14 1918 11/01/14 0143 11/01/14 0558  BP: 125/67 102/63 109/59 114/70  Pulse: 78 80 69 68  Temp: 98.2 F (36.8 C) 97.8 F (36.6 C) 98.1 F (36.7 C) 97.8 F (36.6 C)  TempSrc: Oral Oral Oral Oral  Resp: 16 18 18 18   Height:      Weight:      SpO2: 98% 97% 97% 97%    GEN- The patient is well appearing, alert and oriented x 3 today.   Head- normocephalic, atraumatic Eyes-  Sclera clear, conjunctiva pink Ears- hearing intact Oropharynx- clear Neck- supple, Lungs- Clear to ausculation bilaterally, normal work of breathing Heart- Regular rate and rhythm, no murmurs, rubs or gallops, PMI not laterally displaced GI- soft, NT, ND, + BS Extremities- no clubbing, cyanosis, or edema MS- no significant deformity or atrophy Skin- no rash or lesion Psych- euthymic mood, full affect   Labs:   Lab Results  Component Value Date   WBC 9.8 10/30/2014   HGB 13.3 10/30/2014   HCT 41.0 10/30/2014   MCV 90.5 10/30/2014   PLT 247 10/30/2014    Recent Labs Lab 10/29/14 1711 10/30/14 0130  NA 137  --   K 3.9  --   CL 104  --   CO2 23  --   BUN 10  --   CREATININE 1.02 1.08  CALCIUM 8.8  --   PROT 6.3  --   BILITOT 0.5  --   ALKPHOS 47  --   ALT 36  --   AST 20  --   GLUCOSE 81  --      Radiology/Studies: Ct Angio Head W/cm &/or Wo Cm 10/31/2014   CLINICAL DATA:  Acute LEFT brain stroke. Continued evaluation for etiology. Initial encounter. Stroke risk factors include smoking, THC  use, obesity, hyperlipidemia, and obstructive sleep apnea.  EXAM: CT ANGIOGRAPHY HEAD AND NECK  TECHNIQUE: Multidetector CT imaging of the head and neck was performed using the standard protocol during bolus administration of intravenous contrast. Multiplanar CT image reconstructions and MIPs were obtained to evaluate the vascular anatomy. Carotid stenosis measurements (  when applicable) are obtained utilizing NASCET criteria, using the distal internal carotid diameter as the denominator.  CONTRAST:  47mL OMNIPAQUE IOHEXOL 350 MG/ML SOLN  COMPARISON:  CT head without contrast 10/29/2014. MR head without contrast 10/30/2014  FINDINGS: CT HEAD  Calvarium and skull base: No fracture or destructive lesion. Mastoids and middle ears are grossly clear.  Paranasal sinuses: Imaged portions are clear.  Orbits: Negative.  Brain: Slight premature for age atrophy. Evolving cytotoxic edema LEFT insula, LEFT lateral temporal lobe, and subcortical white matter. No hemorrhage.  CTA NECK  Aortic arch: Standard branching. Imaged portion shows no evidence of aneurysm or dissection. No significant stenosis of the major arch vessel origins.  Right carotid system: No evidence of dissection, stenosis (50% or greater) or occlusion.  Left carotid system: No evidence of dissection, stenosis (50% or greater) or occlusion.  Vertebral arteries: LEFT vertebral slightly larger. Both are patent and contribute to basilar formation. No evidence of dissection, stenosis (50% or greater) or occlusion.  Nonvascular soft tissues:  Unremarkable.  CTA HEAD  Anterior circulation: No significant stenosis, proximal occlusion, aneurysm, or vascular malformation.  Posterior circulation: No significant stenosis, proximal occlusion, aneurysm, or vascular malformation.  Venous sinuses: As permitted by contrast timing, patent.  Anatomic variants: None of significance.  Delayed phase:   No abnormal intracranial enhancement.  IMPRESSION: Evolving cytotoxic edema  related to LEFT MCA territory infarcts of the insula and temporal lobe.  No extracranial source of atherosclerotic emboli. Specifically, LEFT carotid bifurcation shows no significant atheromatous disease. No dissection or FMD related to the LEFT ICA.  No significant atheromatous disease of the intracranial ICA segments or proximal LEFT middle cerebral artery.   Electronically Signed   By: Rolla Flatten M.D.   On: 10/31/2014 09:53   Dg Chest 2 View 10/29/2014   CLINICAL DATA:  Acute left chest pain since earlier today radiating to the left arm  EXAM: CHEST  2 VIEW  COMPARISON:  09/16/2006  FINDINGS: The heart size and mediastinal contours are within normal limits. Both lungs are clear. The visualized skeletal structures are unremarkable.  IMPRESSION: No active cardiopulmonary disease.   Electronically Signed   By: Jerilynn Mages.  Shick M.D.   On: 10/29/2014 18:50   Ct Head Wo Contrast 10/29/2014   CLINICAL DATA:  Stuttering.  Left-sided chest pain.  EXAM: CT HEAD WITHOUT CONTRAST  TECHNIQUE: Contiguous axial images were obtained from the base of the skull through the vertex without intravenous contrast.  COMPARISON:  09/16/2006.  FINDINGS: There is a focal area of low attenuation within the left parietal white matter, image 17/series 2. This measures approximately 1.3 cm and is new when compared with 2008. The right cerebral hemisphere and both cerebellar hemispheres appear normal. Mild prominence of the sulci. The ventricular volumes are unremarkable. There is no evidence for intracranial hemorrhage or mass. There is mucosal thickening involving the ethmoid air cells and sphenoid sinus. The mastoid air cells are clear. The calvarium is intact.  IMPRESSION: 1. Focal area of low attenuation in the left subcortical parietal lobe is new from previous head CT. This is favored to represent either a subacute or chronic infarct.   Electronically Signed   By: Kerby Moors M.D.   On: 10/29/2014 21:32   12-lead ECG sinus rhythm, rate  95, decreased R wave amplitude, normal intervals  Telemetry sinus rhythm  Assessment and Plan:  1. Cryptogenic stroke The patient presents with cryptogenic stroke.  The patient has a TEE planned for this AM.  I  spoke at length with the patient about monitoring for afib with either a 30 day event monitor or an implantable loop recorder.  Risks, benefits, and alteratives to implantable loop recorder were discussed with the patient today.   At this time, the patient is very clear in their decision to proceed with implantable loop recorder.  2.  Atypical chest pain  3.  Sleep disordered breathing  4. Dyslipidemia  Wound care was reviewed with the patient (keep incision clean and dry for 3 days).  Wound check scheduled for 11-13-14 at 11:30AM  The patient is aware of the need for ongoing monitoring and the importance of compliance with keeping his bedside monitor hooked up. He is willing to make lifestyle changes and take anticoagulation if atrial fibrillation identified.   Aggressive risk factor modification Out pt sleep study Stop smoking  Will stop setia and use fenofibrate for HTGlyceridemia   Please call with questions.   Chanetta Marshall, NP 11/01/2014 8:24 AM

## 2014-11-01 NOTE — Progress Notes (Signed)
STROKE TEAM PROGRESS NOTE   HISTORY [name] is a 49 y.o. male who noticed that he began having some difficulty speaking last Sunday. He reports that the symptoms have been persistent since that time. He has some difficulty understanding, but not much. He does have difficulty with word finding, and occasionally misspeaks. He deneis weakness, numbness, visual changes, difficulty walking or other symtpoms. He had chest pain earlier today that is resolved at this time. He described it as "like someone punched me in the left side of my chest." Patient was not administered TPA secondary to delay in arrival. He was admitted for further evaluation and treatment.   SUBJECTIVE (INTERVAL HISTORY) No family members present. The patient feels he is improving. He is scheduled for a TEE and possible loop. He was advised to quit smoking.   OBJECTIVE Temp:  [97.8 F (36.6 C)-98.8 F (37.1 C)] 98 F (36.7 C) (04/29 1310) Pulse Rate:  [65-88] 65 (04/29 1310) Cardiac Rhythm:  [-] Normal sinus rhythm (04/28 2000) Resp:  [16-19] 19 (04/29 1310) BP: (102-129)/(59-77) 115/73 mmHg (04/29 1310) SpO2:  [96 %-98 %] 96 % (04/29 1310)   Recent Labs Lab 10/29/14 1459  GLUCAP 125*    Recent Labs Lab 10/29/14 1711 10/30/14 0130  NA 137  --   K 3.9  --   CL 104  --   CO2 23  --   GLUCOSE 81  --   BUN 10  --   CREATININE 1.02 1.08  CALCIUM 8.8  --     Recent Labs Lab 10/29/14 1711  AST 20  ALT 36  ALKPHOS 47  BILITOT 0.5  PROT 6.3  ALBUMIN 3.6    Recent Labs Lab 10/29/14 1711 10/30/14 0130  WBC 9.3 9.8  NEUTROABS 5.6  --   HGB 13.2 13.3  HCT 39.7 41.0  MCV 89.4 90.5  PLT 236 247    Recent Labs Lab 10/30/14 0130 10/30/14 0530 10/30/14 1137  TROPONINI <0.03 <0.03 <0.03   No results for input(s): LABPROT, INR in the last 72 hours. No results for input(s): COLORURINE, LABSPEC, [place], GLUCOSEU, HGBUR, BILIRUBINUR, KETONESUR, PROTEINUR, UROBILINOGEN, NITRITE, LEUKOCYTESUR in  the last 72 hours.  Invalid input(s): APPERANCEUR     Component Value Date/Time   CHOL 216* 10/30/2014 0530   TRIG 450* 10/30/2014 0530   HDL 23* 10/30/2014 0530   CHOLHDL 9.4 10/30/2014 0530   VLDL UNABLE TO CALCULATE IF TRIGLYCERIDE OVER 400 mg/dL 10/30/2014 0530   LDLCALC UNABLE TO CALCULATE IF TRIGLYCERIDE OVER 400 mg/dL 10/30/2014 0530   Lab Results  Component Value Date   HGBA1C 5.8* 10/30/2014      Component Value Date/Time   LABOPIA NONE DETECTED 10/30/2014 0155   COCAINSCRNUR NONE DETECTED 10/30/2014 0155   LABBENZ POSITIVE* 10/30/2014 0155   AMPHETMU NONE DETECTED 10/30/2014 0155   THCU POSITIVE* 10/30/2014 0155   LABBARB NONE DETECTED 10/30/2014 0155     Recent Labs Lab 10/30/14 0130  ETH <5   I have personally reviewed the radiological images below and agree with the radiology interpretations.  Dg Chest 2 View 10/29/2014    No active cardiopulmonary disease.   Ct Head Wo Contrast 10/29/2014   1. Focal area of low attenuation in the left subcortical parietal lobe is new from previous head CT. This is favored to represent either a subacute or chronic infarct.    MRI Head  10/30/2014    Acute nonhemorrhagic left MCA infarct involving the posterior left sylvian fissure and lateral left temporal  lobe. 2. No acute hemorrhage.   2D Echocardiogram   - Left ventricle: The cavity size was normal. Wall thickness wasincreased in a pattern of mild LVH. The estimated ejectionfraction was 60%. Wall motion was normal; there were no regionalwall motion abnormalities. - Left atrium: The atrium was mildly dilated. - Right ventricle: The cavity size was normal. Systolic functionwas normal. Impressions: No cardiac source of embolism was identified, butcannot be ruled out on the basis of this examination.  CTA head and neck  10/21/2014   Evolving cytotoxic edema related to LEFT MCA territory infarcts of the insula and temporal lobe. No extracranial source of atherosclerotic  emboli. Specifically, LEFT carotid bifurcation shows no significant atheromatous disease. No dissection or FMD related to the LEFT ICA. No significant atheromatous disease of the intracranial ICA segments or proximal LEFT middle cerebral artery.  TEE 11/01/2014 Normal LV EF 65% Normal RV Normal mitral valve Normal aortic valve No signficant aortic debris No ASD/PFO Negative bubble study No LAA thrombus No SOE   PHYSICAL EXAM  Temp:  [97.8 F (36.6 C)-98.8 F (37.1 C)] 98 F (36.7 C) (04/29 1310) Pulse Rate:  [65-88] 65 (04/29 1310) Resp:  [16-19] 19 (04/29 1310) BP: (102-129)/(59-77) 115/73 mmHg (04/29 1310) SpO2:  [96 %-98 %] 96 % (04/29 1310)  General - obesity, well developed, in no apparent distress.  Ophthalmologic - Sharp disc margins OU.  Cardiovascular - Regular rate and rhythm with no murmur.  Mental Status -  Level of arousal and orientation to time, place, and person were intact. Language including expression, naming, comprehension was assessed and found intact, but mild repetition difficulty. Attention span and concentration were normal. Recent and remote memory were intact. Fund of Knowledge was assessed and was intact.  Cranial Nerves II - XII - II - Visual field intact OU. III, IV, VI - Extraocular movements intact. V - Facial sensation intact bilaterally. VII - Facial movement intact bilaterally. VIII - Hearing & vestibular intact bilaterally. X - Palate elevates symmetrically. XI - Chin turning & shoulder shrug intact bilaterally. XII - Tongue protrusion intact.  Motor Strength - The patient's strength was normal in all extremities and pronator drift was absent.  Bulk was normal and fasciculations were absent.   Motor Tone - Muscle tone was assessed at the neck and appendages and was normal.  Reflexes - The patient's reflexes were 1+ in all extremities and he had no pathological reflexes.  Sensory - Light touch, temperature/pinprick were  assessed and were symmetrical.    Coordination - The patient had normal movements in the hands and feet with no ataxia or dysmetria.  Tremor was absent.  Gait and Station - The patient's transfers, posture, gait, station, and turns were observed as normal.   ASSESSMENT/PLAN Mr. [name] is a 49 y.o. male with history of depression, ETOH abuse, smoker, myocarditis as young presenting with difficulty speaking x 5 days and chest pain x 1 day. He did not receive IV t-PA due to delay in arrival.   Stroke:  left MCA infarct, embolic pattern, secondary to unknown source, workup underway  Resultant  Mild repetition difficulty  MRI  L MCA infarcts - posterior left sylvian fissure and lateral left temporal lobe  CTA head and neck showed no related stenosis noted  2D Echo  No source of embolus   LDL unable to calculate, due to high TG  HgbA1c 5.8, WNL  TEE - no LV thrombus, no endocarditis, no PFO  LE venous doppler - Bilateral: No  evidence of DVT, superficial thrombosis, or Baker's Cyst.  Loop recorder placed.   Lovenox 40 mg sq daily for VTE prophylaxisDiet NPO time specified  no antithrombotic prior to admission, now on aspirin 325 mg orally every day  Patient counseled to be compliant with his antithrombotic medications  Ongoing aggressive stroke risk factor management  Therapy recommendations:  No PT or OT, HH SLP  Disposition:  Home with SLP  Hyperlipidemia  Home meds:  No statin   LDL unable to calculate, goal < 70  Added lipitor 40 and zetia 10mg   Continue statin and zetia at discharge  Tobacco abuse  Current smoker  Smoking cessation counseling provided  Nicotine patch provided  Pt is willing to quit  Obstructive Sleep Apnea   Undiagnosed  Symptoms consistent with OSA as per wife  Will need OP sleep study  Other Stroke Risk Factors  ETOH use - 12-18 beers daily, has been drinking since the age of 53. On CIWA protocol.    UDS positive for  THC, which is a stroke risk factor  Obesity, Body mass index is 37.24 kg/(m^2).   Likely undiagnosed Obstructive sleep apnea  Other Active Problems  Chest pain, felt to be reflux. Troponin's neg. Not taking his medication d/t cost.   Low back pain  Depression/anxiety/"anger" issues, on depakote per pt  Other Pertinent History  Congenital heart disease at 57mo of age, on digoxin until age 97/12. Followed at [facility] until the age of 89.   Hx RLE "clot" at 11 mo in the setting of CHF at time of "mycarditis" in 1965 per mother  Hospital day # [place], [place] for Pager information 11/01/2014 1:50 PM   I, the attending vascular neurologist, have personally obtained a history, examined the patient, evaluated laboratory data, individually viewed imaging studies and agree with radiology interpretations. Together with the NP/PA, we formulated the assessment and plan of care which reflects our mutual decision.  I have made any additions or clarifications directly to the above note and agree with the findings and plan as currently documented.   48 yo M with mulitple stroke risk factors including anxiety, depression, smoker, alcohol use admitted for left MCA stroke. Stroke consistent with embolic stroke, CTA head and neck negative. Found to have high TG and LDL, put on lipitor and zetia. Likely to have OSA but need OP sleep study. Need to quit smoking and alcohol. Regular exercise and healthy diet. TEE negative and loop placed.   Neurology will sign off. Please call with questions. Pt will follow up with Dr. Erlinda Hong at [facility] in about 2 months. Thanks for the consult.  Rosalin Hawking, MD PhD Stroke Neurology 11/01/2014 1:50 PM       To contact Stroke Continuity provider, please refer to [email or web address] After hours, contact General Neurology

## 2014-11-01 NOTE — Discharge Summary (Signed)
Physician Discharge Summary  [name] PPI:[id number] DOB: 1966/02/15 DOA: 10/29/2014  PCP: [name], PA-C  Admit date: 10/29/2014 Discharge date: 11/01/2014  Time spent: > 35 minutes  Recommendations for Outpatient Follow-up:   Patient will need follow-up with neurologist Encourage tobacco cessation Was started on aspirin, statin, fenofibrate, and Protonix  Discharge Diagnoses:  Active Problems:   Embolic stroke   CVA (cerebral infarction)   Tobacco use disorder   Mild tetrahydrocannabinol (THC) abuse   Morbid obesity   HLD (hyperlipidemia)   Essential hypertension   Discharge Condition: Stable  Diet recommendation: Heart healthy diet  Filed Weights   10/30/14 0100  Weight: 104.6 kg (230 lb 9.6 oz)    History of present illness:  From original history of present illness: This is a 49 y.o. year old male with significant past medical history of depression, ETOH abuse, congenital heart disease presenting with CVA, chest pain. Pt reports stutterting over the past day. States that this is a new issue for him. Denies any hemiparesis, confusion. No hx/o CVA. Drinks heavily on the weekend-18+ beers per day. Had 1 episode of central chest pain earlier today. Some radiation down the L arm. No assd nausea or diaphoresis. Sxs lasted approx 45 minutes and self resolved. Does report GERD sxs recently. Sxs today not assd w/ eating.   Hospital Course:  Active Problems:  Embolic stroke/CVA (cerebral infarction) - Neurology consulted while patient in house - Patient to undergo loop recorder insertion - MRI obtained and reporting acute nonhemorrhagic left MCA infarct involving the posterior left sylvian fissure and lateral left temporal lobe - Patient to follow-up with neurology within the next 1-2 weeks  Chest pain - Resolved with protonix administration. Will discharge with prescription for Protonix - Troponins 3 negative  Nicotine dependence - Recommended  cessation  Procedures: Dg Chest 2 View 10/29/2014 No active cardiopulmonary disease.   Ct Head Wo Contrast 10/29/2014 1. Focal area of low attenuation in the left subcortical parietal lobe is new from previous head CT. This is favored to represent either a subacute or chronic infarct.   MRI Head  10/30/2014 Acute nonhemorrhagic left MCA infarct involving the posterior left sylvian fissure and lateral left temporal lobe. 2. No acute hemorrhage.   2D Echocardiogram  - Left ventricle: The cavity size was normal. Wall thickness wasincreased in a pattern of mild LVH. The estimated ejectionfraction was 60%. Wall motion was normal; there were no regionalwall motion abnormalities. - Left atrium: The atrium was mildly dilated. - Right ventricle: The cavity size was normal. Systolic functionwas normal. Impressions: No cardiac source of embolism was identified, butcannot be ruled out on the basis of this examination.  CTA head and neck  10/21/2014 Evolving cytotoxic edema related to LEFT MCA territory infarcts of the insula and temporal lobe. No extracranial source of atherosclerotic emboli. Specifically, LEFT carotid bifurcation shows no significant atheromatous disease. No dissection or FMD related to the LEFT ICA. No significant atheromatous disease of the intracranial ICA segments or proximal LEFT middle cerebral artery.   Consultations:  Neurology: Dr. Cornelius Moras  Discharge Exam: Filed Vitals:   11/01/14 1502  BP: 135/83  Pulse: 64  Temp:   Resp: 19    General: Pt in nad, alert and awake Cardiovascular: rrr, no mrg Respiratory: cta bl, no wheezes  Discharge Instructions   Discharge Instructions    Call MD for:  difficulty breathing, headache or visual disturbances    Complete by:  As directed      Call MD  for:  severe uncontrolled pain    Complete by:  As directed      Call MD for:  temperature >100.4    Complete by:  As directed      Diet - low sodium heart  healthy    Complete by:  As directed      Discharge instructions    Complete by:  As directed   Patient to follow up with the neurologist in 1-2 weeks after discharge     Increase activity slowly    Complete by:  As directed           Current Discharge Medication List    START taking these medications   Details  aspirin 325 MG tablet Take 1 tablet (325 mg total) by mouth daily. Qty: 30 tablet, Refills: 0    atorvastatin (LIPITOR) 40 MG tablet Take 1 tablet (40 mg total) by mouth daily at 6 PM. Qty: 30 tablet, Refills: 0    fenofibrate 54 MG tablet Take 1 tablet (54 mg total) by mouth daily. Qty: 30 tablet, Refills: 0    pantoprazole (PROTONIX) 40 MG tablet Take 1 tablet (40 mg total) by mouth daily. Qty: 30 tablet, Refills: 0      CONTINUE these medications which have NOT CHANGED   Details  Asenapine Maleate (SAPHRIS SL) Place 1 tablet under the tongue daily.    benztropine (COGENTIN) 1 MG tablet Take 1 mg by mouth 2 (two) times daily.    diazepam (VALIUM) 5 MG tablet Take 5 mg by mouth every 12 (twelve) hours as needed for anxiety.    divalproex (DEPAKOTE ER) 500 MG 24 hr tablet Take 1,000 mg by mouth 2 (two) times daily.       STOP taking these medications     docusate sodium (COLACE) 100 MG capsule      hydrocortisone (ANUSOL-HC) 2.5 % rectal cream      hydrocortisone (ANUSOL-HC) 25 MG suppository        Allergies  Allergen Reactions  . Codeine Nausea And Vomiting   Follow-up Information    Follow up with [facility] On 11/13/2014.   Why:  at 11:30AM for wound check   Contact information:   [place] [phone]        The results of significant diagnostics from this hospitalization (including imaging, microbiology, ancillary and laboratory) are listed below for reference.    Significant Diagnostic Studies: Ct Angio Head W/cm &/or Wo Cm  10/31/2014   CLINICAL DATA:  Acute LEFT brain stroke. Continued  evaluation for etiology. Initial encounter. Stroke risk factors include smoking, THC use, obesity, hyperlipidemia, and obstructive sleep apnea.  EXAM: CT ANGIOGRAPHY HEAD AND NECK  TECHNIQUE: Multidetector CT imaging of the head and neck was performed using the standard protocol during bolus administration of intravenous contrast. Multiplanar CT image reconstructions and MIPs were obtained to evaluate the vascular anatomy. Carotid stenosis measurements (when applicable) are obtained utilizing NASCET criteria, using the distal internal carotid diameter as the denominator.  CONTRAST:  58mL OMNIPAQUE IOHEXOL 350 MG/ML SOLN  COMPARISON:  CT head without contrast 10/29/2014. MR head without contrast 10/30/2014  FINDINGS: CT HEAD  Calvarium and skull base: No fracture or destructive lesion. Mastoids and middle ears are grossly clear.  Paranasal sinuses: Imaged portions are clear.  Orbits: Negative.  Brain: Slight premature for age atrophy. Evolving cytotoxic edema LEFT insula, LEFT lateral temporal lobe, and subcortical white matter. No hemorrhage.  CTA NECK  Aortic arch: Standard branching. Imaged portion shows no evidence of aneurysm or dissection. No significant stenosis of the major arch vessel origins.  Right carotid system: No evidence of dissection, stenosis (50% or greater) or occlusion.  Left carotid system: No evidence of dissection, stenosis (50% or greater) or occlusion.  Vertebral arteries: LEFT vertebral slightly larger. Both are patent and contribute to basilar formation. No evidence of dissection, stenosis (50% or greater) or occlusion.  Nonvascular soft tissues:  Unremarkable.  CTA HEAD  Anterior circulation: No significant stenosis, proximal occlusion, aneurysm, or vascular malformation.  Posterior circulation: No significant stenosis, proximal occlusion, aneurysm, or vascular malformation.  Venous sinuses: As permitted by contrast timing, patent.  Anatomic variants: None of significance.  Delayed phase:    No abnormal intracranial enhancement.  IMPRESSION: Evolving cytotoxic edema related to LEFT MCA territory infarcts of the insula and temporal lobe.  No extracranial source of atherosclerotic emboli. Specifically, LEFT carotid bifurcation shows no significant atheromatous disease. No dissection or FMD related to the LEFT ICA.  No significant atheromatous disease of the intracranial ICA segments or proximal LEFT middle cerebral artery.   Electronically Signed   By: Rolla Flatten M.D.   On: 10/31/2014 09:53   Dg Chest 2 View  10/29/2014   CLINICAL DATA:  Acute left chest pain since earlier today radiating to the left arm  EXAM: CHEST  2 VIEW  COMPARISON:  09/16/2006  FINDINGS: The heart size and mediastinal contours are within normal limits. Both lungs are clear. The visualized skeletal structures are unremarkable.  IMPRESSION: No active cardiopulmonary disease.   Electronically Signed   By: Jerilynn Mages.  Shick M.D.   On: 10/29/2014 18:50   Ct Head Wo Contrast  10/29/2014   CLINICAL DATA:  Stuttering.  Left-sided chest pain.  EXAM: CT HEAD WITHOUT CONTRAST  TECHNIQUE: Contiguous axial images were obtained from the base of the skull through the vertex without intravenous contrast.  COMPARISON:  09/16/2006.  FINDINGS: There is a focal area of low attenuation within the left parietal white matter, image 17/series 2. This measures approximately 1.3 cm and is new when compared with 2008. The right cerebral hemisphere and both cerebellar hemispheres appear normal. Mild prominence of the sulci. The ventricular volumes are unremarkable. There is no evidence for intracranial hemorrhage or mass. There is mucosal thickening involving the ethmoid air cells and sphenoid sinus. The mastoid air cells are clear. The calvarium is intact.  IMPRESSION: 1. Focal area of low attenuation in the left subcortical parietal lobe is new from previous head CT. This is favored to represent either a subacute or chronic infarct.   Electronically Signed    By: Kerby Moors M.D.   On: 10/29/2014 21:32   Ct Angio Neck W/cm &/or Wo/cm  10/31/2014   CLINICAL DATA:  Acute LEFT brain stroke. Continued evaluation for etiology. Initial encounter. Stroke risk factors include smoking, THC use, obesity, hyperlipidemia, and obstructive sleep apnea.  EXAM: CT ANGIOGRAPHY HEAD AND NECK  TECHNIQUE: Multidetector CT imaging of the head and neck was performed using the standard protocol during bolus administration of intravenous contrast. Multiplanar CT image reconstructions and MIPs were obtained to evaluate the vascular anatomy. Carotid stenosis measurements (when applicable) are obtained utilizing NASCET criteria, using the distal internal carotid diameter as the denominator.  CONTRAST:  26mL OMNIPAQUE IOHEXOL 350 MG/ML SOLN  COMPARISON:  CT head without contrast 10/29/2014. MR head without contrast 10/30/2014  FINDINGS: CT HEAD  Calvarium and skull base: No fracture or destructive lesion. Mastoids and  middle ears are grossly clear.  Paranasal sinuses: Imaged portions are clear.  Orbits: Negative.  Brain: Slight premature for age atrophy. Evolving cytotoxic edema LEFT insula, LEFT lateral temporal lobe, and subcortical white matter. No hemorrhage.  CTA NECK  Aortic arch: Standard branching. Imaged portion shows no evidence of aneurysm or dissection. No significant stenosis of the major arch vessel origins.  Right carotid system: No evidence of dissection, stenosis (50% or greater) or occlusion.  Left carotid system: No evidence of dissection, stenosis (50% or greater) or occlusion.  Vertebral arteries: LEFT vertebral slightly larger. Both are patent and contribute to basilar formation. No evidence of dissection, stenosis (50% or greater) or occlusion.  Nonvascular soft tissues:  Unremarkable.  CTA HEAD  Anterior circulation: No significant stenosis, proximal occlusion, aneurysm, or vascular malformation.  Posterior circulation: No significant stenosis, proximal occlusion,  aneurysm, or vascular malformation.  Venous sinuses: As permitted by contrast timing, patent.  Anatomic variants: None of significance.  Delayed phase:   No abnormal intracranial enhancement.  IMPRESSION: Evolving cytotoxic edema related to LEFT MCA territory infarcts of the insula and temporal lobe.  No extracranial source of atherosclerotic emboli. Specifically, LEFT carotid bifurcation shows no significant atheromatous disease. No dissection or FMD related to the LEFT ICA.  No significant atheromatous disease of the intracranial ICA segments or proximal LEFT middle cerebral artery.   Electronically Signed   By: Rolla Flatten M.D.   On: 10/31/2014 09:53   Mr Jodene Nam Head/brain Wo Cm  10/30/2014   CLINICAL DATA:  Difficulty speaking beginning 3 days ago.  EXAM: MRA HEAD WITHOUT CONTRAST  TECHNIQUE: Angiographic images of the Circle of Willis were obtained using MRA technique without intravenous contrast.  COMPARISON:  CT HEAD WITHOUT CONTRAST 10/29/2014  FINDINGS: The diffusion weighted images confirm an acute nonhemorrhagic infarct along the posterior left sylvian fissure and lateral left temporal lobe. There is no hemorrhage or mass lesion. Ventricles are of normal size. No significant extra-axial fluid collection is present.  IMPRESSION: 1. Acute nonhemorrhagic left MCA infarct involving the posterior left sylvian fissure and lateral left temporal lobe. 2. No acute hemorrhage.   Electronically Signed   By: San Morelle M.D.   On: 10/30/2014 17:11    Microbiology: No results found for this or any previous visit (from the past 240 hour(s)).   Labs: Basic Metabolic Panel:  Recent Labs Lab 10/29/14 1711 10/30/14 0130  NA 137  --   K 3.9  --   CL 104  --   CO2 23  --   GLUCOSE 81  --   BUN 10  --   CREATININE 1.02 1.08  CALCIUM 8.8  --    Liver Function Tests:  Recent Labs Lab 10/29/14 1711  AST 20  ALT 36  ALKPHOS 47  BILITOT 0.5  PROT 6.3  ALBUMIN 3.6   No results for  input(s): LIPASE, AMYLASE in the last 168 hours. No results for input(s): AMMONIA in the last 168 hours. CBC:  Recent Labs Lab 10/29/14 1711 10/30/14 0130  WBC 9.3 9.8  NEUTROABS 5.6  --   HGB 13.2 13.3  HCT 39.7 41.0  MCV 89.4 90.5  PLT 236 247   Cardiac Enzymes:  Recent Labs Lab 10/30/14 0130 10/30/14 0530 10/30/14 1137  TROPONINI <0.03 <0.03 <0.03   BNP: BNP (last 3 results) No results for input(s): BNP in the last 8760 hours.  ProBNP (last 3 results) No results for input(s): PROBNP in the last 8760 hours.  CBG:  Recent  Labs Lab 10/29/14 1459  GLUCAP 125*     Signed:  Velvet Bathe  [facility] 11/01/2014, 3:13 PM

## 2014-11-01 NOTE — CV Procedure (Signed)
TEE:   7mg  versed 50 mg Benedryl and 75 ug fentanyl  Normal LV EF 65% Normal RV Normal mitral valve Normal aortic valve No signficant aortic debris No ASD/PFO Negative bubble study No LAA thrombus No SOE  Will proceed with Dr Bosie Helper for loop recorder for cryptogenic stroke  Jenkins Rouge

## 2014-11-01 NOTE — Progress Notes (Signed)
VASCULAR LAB PRELIMINARY  PRELIMINARY  PRELIMINARY  PRELIMINARY  Bilateral lower extremity venous duplex completed.    Preliminary report:  Bilateral:  No evidence of DVT, superficial thrombosis, or Baker's Cyst.   Chancey Cullinane, RVS 11/01/2014, 11:39 AM

## 2014-11-01 NOTE — Progress Notes (Signed)
  Echocardiogram Echocardiogram Transesophageal has been performed.  [name] 11/01/2014, 3:35 PM

## 2014-11-01 NOTE — Progress Notes (Signed)
CARE MANAGEMENT NOTE 11/01/2014  Patient:  [name],[name]   Account Number:  [id number]  Date Initiated:  11/01/2014  Documentation initiated by:  Lorne Skeens  Subjective/Objective Assessment:   Patient was admitted with CVA. Lives at home with spouse     Action/Plan:   Will follow for discharge needs pending PT/OT evals and physician orders.   Anticipated DC Date:  11/01/2014   Anticipated DC Plan:  [place]  CM consult      Choice offered to / List presented to:  C-3 Spouse        HH arranged  [place].   Status of service:  Completed, signed off Medicare Important Message given?   (If response is "NO", the following Medicare IM given date fields will be blank) Date Medicare IM given:   Medicare IM given by:   Date Additional Medicare IM given:   Additional Medicare IM given by:    Discharge Disposition:  [place]  Per UR Regulation:  Reviewed for med. necessity/level of care/duration of stay  If discussed at [place] of Stay Meetings, dates discussed:    Comments:  11/01/14 [place], MSN, CM- Met with patient's wife to discuss home health needs. Patient's wife chose [facility] for HHSL[facility].  Miranda with [facility] was notified and has accepted the referral for discharge home later this evening.  Address and phone number were verified and are correct in the system.

## 2014-11-01 NOTE — Progress Notes (Signed)
Patient discharged home with wife. RN discussed discharge instructions, including s/sx of stroke/tia, and medication instructions. Patient vocalized understanding of both sets of instructions. Patient was given discharge instructions and prescriptions. Patient denies pain, neuro assessment intact, loop recorder bandage with intact with old drainage. Patient educated regarding care of site. Patient advised to follow up with neurology.

## 2014-11-02 ENCOUNTER — Encounter (HOSPITAL_COMMUNITY): Payer: Self-pay | Admitting: Internal Medicine

## 2014-11-04 ENCOUNTER — Encounter (HOSPITAL_COMMUNITY): Payer: Self-pay | Admitting: Cardiovascular Disease

## 2014-11-04 NOTE — Telephone Encounter (Signed)
Patient is on your schedule for a physical on 11/22/14 with Dorothea Ogle PA

## 2014-11-05 ENCOUNTER — Other Ambulatory Visit: Payer: Self-pay | Admitting: *Deleted

## 2014-11-05 DIAGNOSIS — R079 Chest pain, unspecified: Secondary | ICD-10-CM

## 2014-11-05 DIAGNOSIS — I639 Cerebral infarction, unspecified: Secondary | ICD-10-CM

## 2014-11-06 ENCOUNTER — Telehealth: Payer: Self-pay

## 2014-11-06 NOTE — Telephone Encounter (Signed)
Lower Extremity Venous Duplex Bilateral  Status: Finalresult Visible to patient:  MyChart Nextappt: 11/13/2014 at 11:30 AM in Cardiology ([facility] Device 1)       Notes Recorded by Newt Minion, RN on 11/06/2014 at 5:11 PM Pt made aware of results no questions at this time.  Pt reminded of appointment on 5/11 Notes Recorded by Josue Hector, MD on 11/04/2014 at 3:59 PM No DVT or cysst on left leg

## 2014-11-06 NOTE — Telephone Encounter (Signed)
Please print this for [name].    Transition of Care visits require this phone call documenting the items below from nurse call to patient within 48 hours/2 business days after we received notification that they have been discharged from hospitalization.  When is his f/u visit?

## 2014-11-11 NOTE — Telephone Encounter (Signed)
Patient states that he is doing okay. Patient didn't want to discuss medications over the phone with me. He states that there is a lot of things going on over the phone such as fraud. Patient has a follow up appointment 11/22/14

## 2014-11-13 ENCOUNTER — Ambulatory Visit (INDEPENDENT_AMBULATORY_CARE_PROVIDER_SITE_OTHER): Payer: Managed Care, Other (non HMO) | Admitting: *Deleted

## 2014-11-13 DIAGNOSIS — I639 Cerebral infarction, unspecified: Secondary | ICD-10-CM

## 2014-11-13 LAB — CUP PACEART INCLINIC DEVICE CHECK: MDC IDC SESS DTM: 20160511125630

## 2014-11-14 ENCOUNTER — Telehealth (HOSPITAL_COMMUNITY): Payer: Self-pay | Admitting: *Deleted

## 2014-11-14 NOTE — Telephone Encounter (Signed)
Patient given detailed instructions per Myocardial Perfusion Study Information Sheet for test on 11/15/14 at 8:15 Patient verbalized understanding. Crissie Figures, RN

## 2014-11-15 ENCOUNTER — Ambulatory Visit (HOSPITAL_COMMUNITY): Payer: Managed Care, Other (non HMO) | Attending: Internal Medicine

## 2014-11-15 DIAGNOSIS — R0609 Other forms of dyspnea: Secondary | ICD-10-CM | POA: Insufficient documentation

## 2014-11-15 DIAGNOSIS — I10 Essential (primary) hypertension: Secondary | ICD-10-CM | POA: Diagnosis not present

## 2014-11-15 DIAGNOSIS — R079 Chest pain, unspecified: Secondary | ICD-10-CM | POA: Diagnosis not present

## 2014-11-15 DIAGNOSIS — I639 Cerebral infarction, unspecified: Secondary | ICD-10-CM | POA: Diagnosis not present

## 2014-11-15 DIAGNOSIS — R0600 Dyspnea, unspecified: Secondary | ICD-10-CM | POA: Diagnosis not present

## 2014-11-15 DIAGNOSIS — Z8673 Personal history of transient ischemic attack (TIA), and cerebral infarction without residual deficits: Secondary | ICD-10-CM | POA: Diagnosis not present

## 2014-11-15 LAB — MYOCARDIAL PERFUSION IMAGING
CHL CUP NUCLEAR SDS: 3
CHL CUP RESTING HR STRESS: 82 {beats}/min
CHL CUP STRESS STAGE 1 DBP: 96 mmHg
CHL CUP STRESS STAGE 1 GRADE: 0 %
CHL CUP STRESS STAGE 1 SPEED: 0 mph
CHL CUP STRESS STAGE 2 HR: 85 {beats}/min
CHL CUP STRESS STAGE 3 DBP: 93 mmHg
CHL CUP STRESS STAGE 3 HR: 107 {beats}/min
CHL CUP STRESS STAGE 5 SBP: 130 mmHg
CHL CUP STRESS STAGE 6 GRADE: 0 %
CSEPEW: 1 METS
CSEPPHR: 104 {beats}/min
LHR: 0.3
LV dias vol: 118 mL
LV sys vol: 61 mL
NUC STRESS EF: 48 %
Percent of predicted max HR: 61 %
SRS: 1
SSS: 4
Stage 1 HR: 86 {beats}/min
Stage 1 SBP: 132 mmHg
Stage 2 Grade: 0 %
Stage 2 Speed: 0 mph
Stage 3 Grade: 0 %
Stage 3 SBP: 131 mmHg
Stage 3 Speed: 0 mph
Stage 4 Grade: 0 %
Stage 4 HR: 104 {beats}/min
Stage 4 Speed: 0 mph
Stage 5 DBP: 94 mmHg
Stage 5 Grade: 0 %
Stage 5 HR: 102 {beats}/min
Stage 5 Speed: 0 mph
Stage 6 DBP: 93 mmHg
Stage 6 HR: 96 {beats}/min
Stage 6 SBP: 143 mmHg
Stage 6 Speed: 0 mph
TID: 0.99

## 2014-11-15 MED ORDER — REGADENOSON 0.4 MG/5ML IV SOLN
0.4000 mg | Freq: Once | INTRAVENOUS | Status: AC
Start: 2014-11-15 — End: 2014-11-15
  Administered 2014-11-15: 0.4 mg via INTRAVENOUS

## 2014-11-15 MED ORDER — TECHNETIUM TC 99M SESTAMIBI GENERIC - CARDIOLITE
33.0000 | Freq: Once | INTRAVENOUS | Status: AC | PRN
  Administered 2014-11-15: 33 via INTRAVENOUS

## 2014-11-15 MED ORDER — TECHNETIUM TC 99M SESTAMIBI GENERIC - CARDIOLITE
11.0000 | Freq: Once | INTRAVENOUS | Status: AC | PRN
  Administered 2014-11-15: 11 via INTRAVENOUS

## 2014-11-22 ENCOUNTER — Telehealth: Payer: Self-pay | Admitting: Medical

## 2014-11-22 ENCOUNTER — Ambulatory Visit (INDEPENDENT_AMBULATORY_CARE_PROVIDER_SITE_OTHER): Payer: Managed Care, Other (non HMO) | Admitting: Medical

## 2014-11-22 ENCOUNTER — Encounter: Payer: Self-pay | Admitting: Medical

## 2014-11-22 VITALS — BP 110/70 | HR 93 | Temp 97.5°F | Resp 15

## 2014-11-22 DIAGNOSIS — Z720 Tobacco use: Secondary | ICD-10-CM

## 2014-11-22 DIAGNOSIS — F3130 Bipolar disorder, current episode depressed, mild or moderate severity, unspecified: Secondary | ICD-10-CM | POA: Diagnosis not present

## 2014-11-22 DIAGNOSIS — I6340 Cerebral infarction due to embolism of unspecified cerebral artery: Secondary | ICD-10-CM

## 2014-11-22 DIAGNOSIS — R2241 Localized swelling, mass and lump, right lower limb: Secondary | ICD-10-CM | POA: Diagnosis not present

## 2014-11-22 DIAGNOSIS — F1210 Cannabis abuse, uncomplicated: Secondary | ICD-10-CM | POA: Diagnosis not present

## 2014-11-22 DIAGNOSIS — I10 Essential (primary) hypertension: Secondary | ICD-10-CM | POA: Diagnosis not present

## 2014-11-22 DIAGNOSIS — I639 Cerebral infarction, unspecified: Secondary | ICD-10-CM

## 2014-11-22 DIAGNOSIS — F319 Bipolar disorder, unspecified: Secondary | ICD-10-CM

## 2014-11-22 DIAGNOSIS — F17200 Nicotine dependence, unspecified, uncomplicated: Secondary | ICD-10-CM

## 2014-11-22 DIAGNOSIS — E785 Hyperlipidemia, unspecified: Secondary | ICD-10-CM | POA: Diagnosis not present

## 2014-11-22 NOTE — Telephone Encounter (Signed)
Refer to general surgery for lump on right thigh, needs consult now, but surgery needs to be deferred a few months.  He just had a stroke but wants to go ahead and have consult

## 2014-11-22 NOTE — Addendum Note (Signed)
Addended by: Carlena Hurl on: 11/22/2014 10:25 AM   Modules accepted: Orders

## 2014-11-22 NOTE — Patient Instructions (Addendum)
Diagnosis: Encounter Diagnoses  Name Primary?  . Embolic stroke Yes  . CVA (cerebral vascular accident)   . Tobacco use disorder   . Mild tetrahydrocannabinol (THC) abuse   . Morbid obesity   . Hyperlipidemia   . Essential hypertension   . Bipolar depression   . Lump of thigh, right     Recommendations:  You've had a stroke.  This is a big deal, a life changing issue!  I strongly recommend this be a reason to change course for the sake of your physical, mental, and spiritual health.   This means no longer using tobacco, or alcohol, or marijuana in the way you have  I strongly recommend you STOP tobacco as it is killing you!  I recommend you start gradually weaning down on alcohol to the point you occasionally drink a beer, certainly not 18 beers or 8 beers at one time.  Continue the new medications that were started with the hospitalization  Diet recommendations: Increase your water intake, get at least 64 ounces of water daily Eat 3-4 fruits daily Eat plenty of vegetables throughout the day, preferably each meal Eat good sources of grains such as oatmeal, barley, whole grain pasta, whole grain bread, but limit the serving size to 1 cup of oatmeal or pasta per meal or 2 slices of bread per meal We don't need to meat at each meal, however if you do eat meat, limit serving size to the size of your palm, and eat chicken fish or [place], lean cuts of meat Eat beans every day as this is a good nutrient source and helps to curb appetite Break the cycle of eating fast food or McDonal[employer].   Cook more foods at home.  Things to limit or avoid: Red meat Avoid fast food, fried foods, fatty foods Limit sweets, ice cream, cake and other baked goods Avoid soda, alcohol, sweet tea   Follow up with your psychiatrist and begin counseling.  Have Dr. Toy Care and counselor work to help you come off alcohol.     I would recommend you go to church for the sake of your family, your mental and  spiritual health, and to strenghten the relationship with your sons.    I go to [employer] [place] [facility] We'd love to see you Sunday  [email or web address]  [place]  9:30am, 11:00am [place], [place] [address]   [place]  9:30am, 11:00am [place], [place] [address]   [place]  9:00am, 10:15am, 11:30am, 5:00pm [address]  [place], [place] [address]

## 2014-11-22 NOTE — Progress Notes (Addendum)
Subjective Here for hospital follow up.     Admit date: 10/29/14 Discharge date: 11/01/14  Mr. [name] was admitted for CVA, embolic stroke, tobacco use, THC use, obesity, hyperlipidemia and HTN.  New medications started per hospitalization include aspirin, statin, fenofibrate, and protonix.  Patient's history as patient was initially in exam room alone. He has hx/o depression, ETOH abuse, congenital heart disease, recent CVA and chest pain, and recent stuttering that left to the hospitalization.  Since the hospital visit he had felt ok up until this past week.  Tuesday mowed the yard, did a lot of around the house work and over did it.   He and wife went on business meeting this week, and he got confused trying to find the right road down in [place], [place]. He felt like he couldn't find his way.  He was with his wife.  The last 2 days has felt weak.  Last night was getting bad heartburn, felt nauseated.  Took medication last night, and this morning feels dizzy, doesn't feel right.    Separately I got wife back in a different room to get info while we got an EKG.   She notes that he has been fatigued since the stroke, wife says he gets tired very easy since last visit.   He has been trying to go his normal pace despite the stroke and he doesn't seem to understand what limitations the stroke has given him.  He is having speech therapy twice weekly.  He had nuclear stress test last week, and she tells me lots of info about his diet.   She notes that he eats out fast food often, and usually his norm is to get the Big Mac combo + a separate burger of fried fish sandwich every time.  She says he knows the whole menu at [facility] and eats there often. He binge drinks 18 beers at a time including the day before the stroke.   Back in the room with both him and wife, he notes that he started doing drugs and alcohol as a young boy.   He has build up a tolerance over the years.  He notes that he primary drinks or  smokes THC with friends, at a party, or episodic but not daily.  He doesn't smoke cigarette every day either.  He notes that since the stroke he can only drink 8 beers at a time instead of 18.    No new weakness, numbness, blurred vision, no hearing changes, no additional chest or arm pain.  No fall, but has been dizzy and still slow to come up with thoughts and stuttering.  He has 2 adult sons, and he mentions today that relationship with the younger son not always the best.   He is bipolar, sees Dr. Toy Care, and is considering counseling.     Past Medical History  Diagnosis Date  . Depression   . Congenital heart disease   . Back pain   . History of ETOH abuse   . CHF (congestive heart failure)   . Essential hypertension    ROS as in subjective  Objective: BP 110/70 mmHg  Pulse 93  Temp(Src) 97.5 F (36.4 C) (Oral)  Resp 15  General appearance: alert, no distress, WD/WN, speaking slow, stuttering at times, seems to have hard time getting thoughts out at time.   HEENT: normocephalic, sclerae anicteric, PERRLA, EOMi, nares patent, no discharge or erythema, pharynx normal Oral cavity: MMM, no lesions Neck: supple, no lymphadenopathy, no thyromegaly,  no masses, no bruits Heart: RRR, normal S1, S2, no murmurs Lungs: CTA bilaterally, no wheezes, rhonchi, or rales Extremities: no edema, no cyanosis, no clubbing Pulses: 2+ symmetric, upper and lower extremities, normal cap refill Neurological: speech slow at times, stuttering some, and difficulty processing thoughts at times, otherwise alert, oriented x 3, CN2-12 intact, strength normal upper extremities and lower extremities, sensation normal throughout, DTRs 2+ throughout,he is cautious with gait, and was a little wavering with romberg and couldn't attempt heel to toe Psychiatric: normal affect, behavior normal, pleasant  Right upper posterior thigh with 4cm raised lump, possible cyst vs other lump    Adult ECG Report  Indication:  recent stroke, weakness today  Rate: 86 bpm  Rhythm: normal sinus rhythm  QRS Axis: 4 degrees  PR Interval: 134ms  QRS Duration: 55ms  QTc: 493ms  Conduction Disturbances: none  Other Abnormalities: none  Patient's cardiac risk factors are: dyslipidemia, hypertension, male gender, obesity (BMI >= 30 kg/m2) and smoking/ tobacco exposure.  EKG comparison: 10/29/14  Narrative Interpretation: no acute changes     Assessment: Encounter Diagnoses  Name Primary?  . Embolic stroke Yes  . CVA (cerebral vascular accident)   . Tobacco use disorder   . Mild tetrahydrocannabinol (THC) abuse   . Morbid obesity   . Hyperlipidemia   . Essential hypertension   . Bipolar depression   . Lump of thigh, right     Plan: Reviewed hospitalization reports, labs, scans, and discharge summary.  Spent over an hour today face to face discussing his recent stroke, his poor diet choices, substances abuse, and discussed his relationship with his sons and wife.   I tried to make it very clear that if he doesn't change course with his diet and substance abuse, he will continue to have stroke or MI.   I tried to counsel on lifestyle changes, to break the cycle he has experienced his whole life of substance abuse, poor diet, and find a better way.  We discussed diet recommendations, ways to gradually wean down off alcohol, discussed mental, physical and spiritual health.  Counseled on tobacco risks and need to stop.    He will f/u with psychiatry Dr. Toy Care soon, per he and wife will begin counseling soon, and I encouraged him to come to church for the sake of himself and relationship with wife and sons.  Wife is very nice and seems very supportive today.  Advised he take things slower, and that he can't expect to go full force but rather a slow gradual return to activity over the next few months.   He has f/u with neurology on 01/02/15.   I advised he c/t the new medications, and f/u here in 6 wk.  He will return  sooner if needed.   They seem to understand recommendations, plan, and f/u.   Referral to general surgery for consult on the leg lump, but surgery will need to be deferred for months given the recent stroke.  Handout given

## 2014-11-22 NOTE — Telephone Encounter (Signed)
I fax over the patients referral to [facility] fax # [phone] They will contact the patient for his appointment

## 2014-11-29 ENCOUNTER — Ambulatory Visit (INDEPENDENT_AMBULATORY_CARE_PROVIDER_SITE_OTHER): Payer: Managed Care, Other (non HMO) | Admitting: *Deleted

## 2014-11-29 DIAGNOSIS — I639 Cerebral infarction, unspecified: Secondary | ICD-10-CM

## 2014-12-03 ENCOUNTER — Other Ambulatory Visit: Payer: Self-pay | Admitting: Medical

## 2014-12-10 LAB — CUP PACEART REMOTE DEVICE CHECK: Date Time Interrogation Session: 20160607111118

## 2014-12-10 NOTE — Progress Notes (Signed)
Loop recorder 

## 2014-12-20 ENCOUNTER — Encounter: Payer: Self-pay | Admitting: Internal Medicine

## 2014-12-29 ENCOUNTER — Other Ambulatory Visit: Payer: Self-pay | Admitting: Medical

## 2014-12-31 ENCOUNTER — Encounter: Payer: Self-pay | Admitting: Internal Medicine

## 2014-12-31 ENCOUNTER — Ambulatory Visit (INDEPENDENT_AMBULATORY_CARE_PROVIDER_SITE_OTHER): Payer: Managed Care, Other (non HMO) | Admitting: *Deleted

## 2014-12-31 DIAGNOSIS — I639 Cerebral infarction, unspecified: Secondary | ICD-10-CM

## 2015-01-01 LAB — CUP PACEART REMOTE DEVICE CHECK: MDC IDC SESS DTM: 20160629144042

## 2015-01-01 NOTE — Progress Notes (Signed)
Loop recorder 

## 2015-01-02 ENCOUNTER — Encounter: Payer: Self-pay | Admitting: Neurology

## 2015-01-02 ENCOUNTER — Ambulatory Visit (INDEPENDENT_AMBULATORY_CARE_PROVIDER_SITE_OTHER): Payer: Managed Care, Other (non HMO) | Admitting: Neurology

## 2015-01-02 ENCOUNTER — Other Ambulatory Visit: Payer: Self-pay | Admitting: Medical

## 2015-01-02 VITALS — BP 113/75 | HR 77 | Ht 66.0 in | Wt 236.0 lb

## 2015-01-02 DIAGNOSIS — I63032 Cerebral infarction due to thrombosis of left carotid artery: Secondary | ICD-10-CM | POA: Diagnosis not present

## 2015-01-02 MED ORDER — ATORVASTATIN CALCIUM 40 MG PO TABS: 40.0000 mg | ORAL_TABLET | Freq: Every day | ORAL | Status: DC

## 2015-01-02 MED ORDER — FENOFIBRATE 54 MG PO TABS: 54.0000 mg | ORAL_TABLET | Freq: Every day | ORAL | Status: DC

## 2015-01-02 MED ORDER — ASPIRIN 325 MG PO TABS: 325.0000 mg | ORAL_TABLET | Freq: Every day | ORAL | Status: DC

## 2015-01-02 NOTE — Progress Notes (Signed)
PATIENT: [name] DOB: Feb 27, 1966  Chief Complaint  Patient presents with  . Cerebrovascular Accident    He is here with his wife, [name].  He had a stroke in April 2016.  Says he is still easily fatigued.  Feels his speech has not returned to his baseline.  He is no longer in any type of therapy.    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  He was admitted to the hospital in October 30 2014   after 1 week history of word finding difficulty, expressive aphasia, had extensive evaluation   I have personally reviewed MRI of brain   10/30/2014 Acute nonhemorrhagic left MCA infarct involving the posterior left sylvian fissure and lateral left temporal lobe. 2. No acute hemorrhage.   CT angiogram of head and neck, no significant bilateral internal carotid artery stenosis  Echocardiogram, ejection fraction 60%, wall motion was normal   TEE, no cardiac embolic source identified  Laboratory showed A1c of 5.8, cholesterol 216, triglyceride 450  He is now taking daily aspirin  REVIEW OF SYSTEMS: Full 14 system review of systems performed and notable only for fatigue, swelling in legs, snoring, memory loss, confusion, weakness, insomnia, sleepiness, snoring, decreased energy, disinterested in activities,  ALLERGIES: Allergies  Allergen Reactions  . Codeine Nausea And Vomiting    HOME MEDICATIONS: Current Outpatient Prescriptions  Medication Sig Dispense Refill  . Asenapine Maleate (SAPHRIS SL) Place 1 tablet under the tongue as needed.     Marland Kitchen aspirin 325 MG tablet Take 1 tablet (325 mg total) by mouth daily. 30 tablet 0  . atorvastatin  (LIPITOR) 40 MG tablet TAKE 1 TABLET BY MOUTH EVERY DAY AT 6 PM 30 tablet 0  . benztropine (COGENTIN) 1 MG tablet Take 1 mg by mouth 2 (two) times daily.    . diazepam (VALIUM) 5 MG tablet Take 5 mg by mouth every 12 (twelve) hours as needed for anxiety.    . divalproex (DEPAKOTE ER) 500 MG 24 hr tablet Take 1,000 mg by mouth 2 (two) times daily.     . fenofibrate 54 MG tablet TAKE 1 TABLET BY MOUTH EVERY DAY 30 tablet 0  . pantoprazole (PROTONIX) 40 MG tablet TAKE 1 TABLET BY MOUTH EVERY DAY 30 tablet 0     PAST MEDICAL HISTORY: Past Medical History  Diagnosis Date  . Depression   . Congenital heart disease   . Back pain   . History of ETOH abuse   . CHF (congestive heart failure)   . Essential hypertension   . CVA (cerebral vascular accident)     embolic stroke 01/7823  . Congenital heart disease     ? self reported valve issue  . Bipolar depression   . Substance abuse     PAST SURGICAL HISTORY: Past Surgical History  Procedure Laterality Date  . Cardiac catheterization      at age 67  . Back surgery Bilateral 1996  . Skin graft    . Carpal tunnel release    . Loop recorder implant N/A 11/01/2014    Procedure: LOOP RECORDER IMPLANT;  Surgeon: Deboraha Sprang, MD;  Location: [facility] CATH LAB;  Service: Cardiovascular;  Laterality: N/A;  . Tee without cardioversion N/A 11/01/2014    Procedure: TRANSESOPHAGEAL ECHOCARDIOGRAM (TEE);  Surgeon: Josue Hector, MD;  Location: [facility] ENDOSCOPY;  Service: Cardiovascular;  Laterality: N/A;    FAMILY HISTORY: Family History  Problem Relation Age of Onset  . Cancer Maternal Grandmother   . Cancer Paternal Grandfather     lymphoma  . Arthritis Mother   . Hearing loss Mother   . Heart block Father     SOCIAL HISTORY:  History   Social History  . Marital Status: Married    Spouse Name: N/A  . Number of Children: 2  . Years of Education: 8   Occupational History  . Unemployed    Social History Main Topics  . Smoking status:  Current Some Day Smoker -- 0.25 packs/day    Types: Cigarettes  . Smokeless tobacco: Never Used     Comment: Says he smokes more when he is drinking.  . Alcohol Use: 0.0 oz/week    0 Standard drinks or equivalent per week     Comment: 12-18 beers twice weekly  . Drug Use: No  . Sexual Activity: Not on file   Other Topics Concern  . Not on file   Social History Narrative   Lives at home with wife.   Right-handed.   2 cups caffeine daily.     PHYSICAL EXAM   Filed Vitals:   01/02/15 0751  BP: 113/75  Pulse: 77  Height: 5\' 6"  (1.676 m)  Weight: 236 lb (107.049 kg)    Not recorded      Body mass index is 38.11 kg/(m^2).  PHYSICAL EXAMNIATION:  Gen: NAD, conversant, well nourised, obese, well groomed                     Cardiovascular: Regular rate rhythm, no peripheral edema, warm, nontender. Eyes: Conjunctivae clear without exudates or hemorrhage Neck: Supple, no carotid bruise. Pulmonary: Clear to auscultation bilaterally   NEUROLOGICAL EXAM:  MENTAL STATUS: Speech:    Speech is normal; fluent and spontaneous with normal comprehension.  Cognition:    The patient is oriented to person, place, and time;     recent and remote memory intact;     language fluent;     normal attention, concentration,     fund of knowledge.  CRANIAL NERVES: CN II: Visual fields are full to confrontation. Fundoscopic exam is normal with sharp discs and no vascular changes. Pupil equal round reactive to light. CN III, IV, VI: extraocular movement are normal. No ptosis. CN V: Facial sensation is intact to pinprick in all 3 divisions bilaterally. Corneal responses are intact.  CN VII: Face is symmetric with normal eye closure and smile. CN VIII: Hearing is normal to rubbing fingers CN IX, X: Palate elevates symmetrically. Phonation is normal. CN XI: Head turning and shoulder shrug are intact CN XII: Tongue is midline with normal movements and no atrophy.  MOTOR: There is no  pronator drift of out-stretched arms. Muscle bulk and tone are normal. Muscle strength is normal.  REFLEXES: Reflexes are 2+ and symmetric at the biceps, triceps, knees, and ankles. Plantar responses are flexor.  SENSORY: Light touch, pinprick, position sense, and vibration sense are intact in fingers and toes.  COORDINATION: Rapid alternating movements and fine finger movements are intact. There is no dysmetria on finger-to-nose and heel-knee-shin. There are no abnormal or extraneous movements.   GAIT/STANCE: Mildly antalgic, he is able to perform tiptoe, heel walking, no difficulty with  tandem walking.   DIAGNOSTIC DATA (LABS, IMAGING, TESTING) - I reviewed patient records, labs, notes, testing and imaging myself where available.  Lab Results  Component Value Date   WBC 9.8 10/30/2014   HGB 13.3 10/30/2014   HCT 41.0 10/30/2014   MCV 90.5 10/30/2014   PLT 247 10/30/2014      Component Value Date/Time   NA 137 10/29/2014 1711   K 3.9 10/29/2014 1711   CL 104 10/29/2014 1711   CO2 23 10/29/2014 1711   GLUCOSE 81 10/29/2014 1711   BUN 10 10/29/2014 1711   CREATININE 1.08 10/30/2014 0130   CALCIUM 8.8 10/29/2014 1711   PROT 6.3 10/29/2014 1711   ALBUMIN 3.6 10/29/2014 1711   AST 20 10/29/2014 1711   ALT 36 10/29/2014 1711   ALKPHOS 47 10/29/2014 1711   BILITOT 0.5 10/29/2014 1711   GFRNONAA 78* 10/30/2014 0130   GFRAA >90 10/30/2014 0130   Lab Results  Component Value Date   CHOL 216* 10/30/2014   HDL 23* 10/30/2014   LDLCALC UNABLE TO CALCULATE IF TRIGLYCERIDE OVER 400 mg/dL 10/30/2014   TRIG 450* 10/30/2014   CHOLHDL 9.4 10/30/2014   Lab Results  Component Value Date   ASSESSMENT AND PLAN  [name] is a 49 y.o. male with multiple vascular risk factors, alcohol abuse, smoke, obesity, hypertension, hyperlipidemia,presented with left MCA stroke in April 2016    1, continue address his vascular risk factor, optimize blood pressure control, less than 130  out of 80, LDL less than 100 2, daily aspirin, 3, stop smoking, alcohol use, moderate exercise, 4, return to clinic for new issues   Marcial Pacas, M.D. Ph.D.  [facility] [address], [place], [place] [address] Ph: [phone] Fax: [phone]

## 2015-01-27 ENCOUNTER — Encounter: Payer: Self-pay | Admitting: Internal Medicine

## 2015-01-30 ENCOUNTER — Ambulatory Visit (INDEPENDENT_AMBULATORY_CARE_PROVIDER_SITE_OTHER): Payer: Managed Care, Other (non HMO) | Admitting: *Deleted

## 2015-01-30 DIAGNOSIS — I639 Cerebral infarction, unspecified: Secondary | ICD-10-CM

## 2015-02-10 NOTE — Progress Notes (Signed)
Loop recorder 

## 2015-02-14 LAB — CUP PACEART REMOTE DEVICE CHECK: Date Time Interrogation Session: 20160812162227

## 2015-02-24 ENCOUNTER — Ambulatory Visit: Payer: Self-pay | Admitting: Surgery

## 2015-02-24 NOTE — H&P (Signed)
Guage Efferson 02/24/2015 10:51 AM Location: [facility] Patient #: [id number] DOB: 09-22-1965 Married / Language: Cleophus Molt / Race: White Male History of Present Illness [name] A. Kyian Obst MD; 02/24/2015 12:50 PM) Patient words: new-lump on thigh  pt here for an umbilical hernia. Pt sent at the request of [name] for umbilical hernia. He has a history of multiple lipoma as well. He has one on his posterior thigh but that is not why he is here. The hernia is at his belly button and pokes out. It is getting larger and causing mild discomfort. It pops in and out. No nausea vomiting or issues with his bowels. Smokes intermittently. Lipoma over his posterior thigh causes mild discomfort with sitting. It has been present for years.  The patient is a 49 year old male   Allergies Malachi Bonds; 02/24/2015 10:51 AM) Codeine Sulfate *ANALGESICS - OPIOID*  Medication History ([name]; 02/24/2015 10:53 AM) Diazepam (5MG  Tablet, Oral) Active. Aspirin (325MG  Tablet, Oral) Active. Atorvastatin Calcium (40MG  Tablet, Oral) Active. Benztropine Mesylate (1MG  Tablet, Oral) Active. Pantoprazole Sodium (40MG  Tablet DR, Oral) Active. Fenofibrate (54MG  Tablet, Oral) Active. Naltrexone HCl (50MG  Tablet, Oral) Active. Medications Reconciled    Vitals Malachi Bonds; 02/24/2015 10:51 AM) 02/24/2015 10:51 AM Weight: 241.6 lb Height: 65in Body Surface Area: 2.24 m Body Mass Index: 40.2 kg/m Temp.: 98.43F(Oral)  Pulse: 82 (Regular)  BP: 136/80 (Sitting, Left Arm, Standard)     Physical Exam (Addylin Manke A. Reighn Kaplan MD; 02/24/2015 12:51 PM)  General Mental Status-Alert. General Appearance-Consistent with stated age. Hydration-Well hydrated. Voice-Normal.  Integumentary Note: 3 cm right thigh mass/ lipoma mobile mildly irritated. right forearm lipoma 3 cm mobile   Chest and Lung Exam Chest and lung exam reveals -quiet, even and easy  respiratory effort with no use of accessory muscles and on auscultation, normal breath sounds, no adventitious sounds and normal vocal resonance. Inspection Chest Wall - Normal. Back - normal.  Cardiovascular Cardiovascular examination reveals -on palpation PMI is normal in location and amplitude, no palpable S3 or S4. Normal cardiac borders., normal heart sounds, regular rate and rhythm with no murmurs, carotid auscultation reveals no bruits and normal pedal pulses bilaterally.  Abdomen Note: reducible moderate size umbilical hernia soft reducible 2 cm obese soft non tender   Neurologic Neurologic evaluation reveals -alert and oriented x 3 with no impairment of recent or remote memory. Mental Status-Normal.  Musculoskeletal Normal Exam - Left-Upper Extremity Strength Normal and Lower Extremity Strength Normal. Normal Exam - Right-Upper Extremity Strength Normal, Lower Extremity Weakness.    Assessment & Plan (Varnell Donate A. Talor Desrosiers MD; 5/40/0867 61:95 PM)  UMBILICAL HERNIA WITHOUT OBSTRUCTION AND WITHOUT GANGRENE (553.1  K42.9) Impression: discussed repair and smoking cessation. Discussed risks of complications with smoking and how smoking increases complications. He does not wish any treatment for lipoma currently. The risk of hernia repair include bleeding, infection, organ injury, bowel injury, bladder injury, nerve injury recurrent hernia, blood clots, worsening of underlying condition, chronic pain, mesh use, open surgery, death, and the need for other operattions. Pt agrees to proceed  Current Plans You are being scheduled for surgery - Our schedulers will call you.  You should hear from our office's scheduling department within 5 working days about the location, date, and time of surgery. We try to make accommodations for patient's preferences in scheduling surgery, but sometimes the OR schedule or the surgeon's schedule prevents [place] from making those accommodations.  If  you have not heard from our office [phone]) in 5 working days,  call the office and ask for your surgeon's nurse.  If you have other questions about your diagnosis, plan, or surgery, call the office and ask for your surgeon's nurse. Written instructions provided   The anatomy & physiology of the abdominal wall was discussed. The pathophysiology of hernias was discussed. Natural history risks without surgery including progeressive enlargement, pain, incarceration, & strangulation was discussed. Contributors to complications such as smoking, obesity, diabetes, prior surgery, etc were discussed.  I feel the risks of no intervention will lead to serious problems that outweigh the operative risks; therefore, I recommended surgery to reduce and repair the hernia. I explained laparoscopic techniques with possible need for an open approach. I noted the probable use of mesh to patch and/or buttress the hernia repair  Risks such as bleeding, infection, abscess, need for further treatment, heart attack, death, and other risks were discussed. I noted a good likelihood this will help address the problem. Goals of post-operative recovery were discussed as well. Possibility that this will not correct all symptoms was explained. I stressed the importance of low-impact activity, aggressive pain control, avoiding constipation, & not pushing through pain to minimize risk of post-operative chronic pain or injury. Possibility of reherniation especially with smoking, obesity, diabetes, immunosuppression, and other health conditions was discussed. We will work to minimize complications.  An educational handout further explaining the pathology & treatment options was given as well. Questions were answered. The patient expresses understanding & wishes to proceed with surgery. Pt Education - [facility] Hernia Post-Op HCI (Gross): discussed with patient and provided information. Pt Education - [facility] Pain Control (Gross) Pt  Education - Hernia: discussed with patient and provided information. LIPOMA (214.9  D17.9) Impression: observe for now.  right thigh and left forearm

## 2015-02-28 ENCOUNTER — Ambulatory Visit (INDEPENDENT_AMBULATORY_CARE_PROVIDER_SITE_OTHER): Payer: Managed Care, Other (non HMO) | Admitting: *Deleted

## 2015-02-28 DIAGNOSIS — I639 Cerebral infarction, unspecified: Secondary | ICD-10-CM

## 2015-03-01 ENCOUNTER — Other Ambulatory Visit: Payer: Self-pay | Admitting: Medical

## 2015-03-05 NOTE — Progress Notes (Signed)
Loop recorder 

## 2015-03-14 LAB — CUP PACEART REMOTE DEVICE CHECK: Date Time Interrogation Session: 20160909112435

## 2015-03-14 NOTE — Progress Notes (Signed)
Carelink summary report received. Battery status OK. Normal device function. No new symptom episodes, tachy episodes, brady, or pause episodes. No new AF episodes. Monthly summary reports and ROV with SK in 11/2015. 

## 2015-03-31 ENCOUNTER — Ambulatory Visit (INDEPENDENT_AMBULATORY_CARE_PROVIDER_SITE_OTHER): Payer: Managed Care, Other (non HMO) | Admitting: *Deleted

## 2015-03-31 DIAGNOSIS — I639 Cerebral infarction, unspecified: Secondary | ICD-10-CM | POA: Diagnosis not present

## 2015-04-02 ENCOUNTER — Other Ambulatory Visit: Payer: Self-pay | Admitting: Medical

## 2015-04-03 NOTE — Progress Notes (Signed)
Loop recorder 

## 2015-04-10 LAB — CUP PACEART REMOTE DEVICE CHECK: Date Time Interrogation Session: 20160926210718

## 2015-04-10 NOTE — Progress Notes (Signed)
Carelink summary report received. Battery status OK. Normal device function. No new symptom episodes, tachy episodes, brady, or pause episodes. No new AF episodes. Monthly summary reports and ROV with SK in 11/2015. 

## 2015-04-14 ENCOUNTER — Encounter: Payer: Self-pay | Admitting: Internal Medicine

## 2015-04-15 ENCOUNTER — Encounter: Payer: Self-pay | Admitting: Internal Medicine

## 2015-04-30 ENCOUNTER — Ambulatory Visit (INDEPENDENT_AMBULATORY_CARE_PROVIDER_SITE_OTHER): Payer: Managed Care, Other (non HMO) | Admitting: *Deleted

## 2015-04-30 DIAGNOSIS — I639 Cerebral infarction, unspecified: Secondary | ICD-10-CM | POA: Diagnosis not present

## 2015-05-01 NOTE — Progress Notes (Signed)
Loop recorder 

## 2015-05-02 ENCOUNTER — Encounter (HOSPITAL_BASED_OUTPATIENT_CLINIC_OR_DEPARTMENT_OTHER): Payer: Self-pay | Admitting: *Deleted

## 2015-05-07 ENCOUNTER — Encounter (HOSPITAL_BASED_OUTPATIENT_CLINIC_OR_DEPARTMENT_OTHER)
Admission: RE | Admit: 2015-05-07 | Discharge: 2015-05-07 | Disposition: A | Discharge status: Home / Self Care | Payer: Managed Care, Other (non HMO) | Source: Ambulatory Visit | Attending: Surgery | Admitting: Surgery

## 2015-05-07 DIAGNOSIS — F17200 Nicotine dependence, unspecified, uncomplicated: Secondary | ICD-10-CM | POA: Diagnosis not present

## 2015-05-07 DIAGNOSIS — I739 Peripheral vascular disease, unspecified: Secondary | ICD-10-CM | POA: Diagnosis not present

## 2015-05-07 DIAGNOSIS — Z79899 Other long term (current) drug therapy: Secondary | ICD-10-CM | POA: Diagnosis not present

## 2015-05-07 DIAGNOSIS — I10 Essential (primary) hypertension: Secondary | ICD-10-CM | POA: Diagnosis not present

## 2015-05-07 DIAGNOSIS — Z7982 Long term (current) use of aspirin: Secondary | ICD-10-CM | POA: Diagnosis not present

## 2015-05-07 DIAGNOSIS — K429 Umbilical hernia without obstruction or gangrene: Secondary | ICD-10-CM | POA: Diagnosis not present

## 2015-05-07 DIAGNOSIS — Z885 Allergy status to narcotic agent status: Secondary | ICD-10-CM | POA: Diagnosis not present

## 2015-05-07 DIAGNOSIS — I509 Heart failure, unspecified: Secondary | ICD-10-CM | POA: Diagnosis not present

## 2015-05-07 LAB — COMPREHENSIVE METABOLIC PANEL
ALBUMIN: 3.6 g/dL (ref 3.5–5.0)
ALK PHOS: 40 U/L (ref 38–126)
ALT: 40 U/L (ref 17–63)
ANION GAP: 8 (ref 5–15)
AST: 24 U/L (ref 15–41)
BUN: 10 mg/dL (ref 6–20)
CALCIUM: 8.9 mg/dL (ref 8.9–10.3)
CO2: 28 mmol/L (ref 22–32)
Chloride: 105 mmol/L (ref 101–111)
Creatinine, Ser: 1.19 mg/dL (ref 0.61–1.24)
GFR calc Af Amer: >60 mL/min (ref >60)
Glucose, Bld: 100 mg/dL — ABNORMAL HIGH (ref 65–99)
POTASSIUM: 4.4 mmol/L (ref 3.5–5.1)
Sodium: 141 mmol/L (ref 135–145)
TOTAL PROTEIN: 6.8 g/dL (ref 6.5–8.1)
Total Bilirubin: 0.6 mg/dL (ref 0.3–1.2)

## 2015-05-07 LAB — CBC WITH DIFFERENTIAL/PLATELET
Basophils Absolute: 0 10*3/uL (ref 0.0–0.1)
Basophils Relative: 1 %
EOS ABS: 0.5 10*3/uL (ref 0.0–0.7)
EOS PCT: 6 %
HCT: 45.6 % (ref 39.0–52.0)
Hemoglobin: 14.6 g/dL (ref 13.0–17.0)
LYMPHS PCT: 32 %
Lymphs Abs: 2.7 10*3/uL (ref 0.7–4.0)
MCH: 29.5 pg (ref 26.0–34.0)
MCHC: 32 g/dL (ref 30.0–36.0)
MCV: 92.1 fL (ref 78.0–100.0)
MONO ABS: 1.2 10*3/uL — AB (ref 0.1–1.0)
Monocytes Relative: 14 %
Neutro Abs: 4.1 10*3/uL (ref 1.7–7.7)
Neutrophils Relative %: 47 %
Platelets: 193 10*3/uL (ref 150–400)
RBC: 4.95 MIL/uL (ref 4.22–5.81)
RDW: 14.1 % (ref 11.5–15.5)
WBC: 8.5 10*3/uL (ref 4.0–10.5)

## 2015-05-08 ENCOUNTER — Encounter (HOSPITAL_BASED_OUTPATIENT_CLINIC_OR_DEPARTMENT_OTHER): Payer: Self-pay | Admitting: Anesthesiology

## 2015-05-08 ENCOUNTER — Ambulatory Visit (HOSPITAL_BASED_OUTPATIENT_CLINIC_OR_DEPARTMENT_OTHER): Payer: Managed Care, Other (non HMO) | Admitting: Anesthesiology

## 2015-05-08 ENCOUNTER — Encounter (HOSPITAL_BASED_OUTPATIENT_CLINIC_OR_DEPARTMENT_OTHER)
Admission: RE | Disposition: A | Discharge status: Home / Self Care | Payer: Self-pay | Source: Ambulatory Visit | Attending: Surgery

## 2015-05-08 ENCOUNTER — Ambulatory Visit (HOSPITAL_BASED_OUTPATIENT_CLINIC_OR_DEPARTMENT_OTHER)
Admission: RE | Admit: 2015-05-08 | Discharge: 2015-05-08 | Disposition: A | Discharge status: Home / Self Care | Payer: Managed Care, Other (non HMO) | Source: Ambulatory Visit | Attending: Surgery | Admitting: Surgery

## 2015-05-08 DIAGNOSIS — I10 Essential (primary) hypertension: Secondary | ICD-10-CM | POA: Insufficient documentation

## 2015-05-08 DIAGNOSIS — F17200 Nicotine dependence, unspecified, uncomplicated: Secondary | ICD-10-CM | POA: Insufficient documentation

## 2015-05-08 DIAGNOSIS — Z79899 Other long term (current) drug therapy: Secondary | ICD-10-CM | POA: Insufficient documentation

## 2015-05-08 DIAGNOSIS — K429 Umbilical hernia without obstruction or gangrene: Secondary | ICD-10-CM | POA: Insufficient documentation

## 2015-05-08 DIAGNOSIS — Z7982 Long term (current) use of aspirin: Secondary | ICD-10-CM | POA: Insufficient documentation

## 2015-05-08 DIAGNOSIS — Z885 Allergy status to narcotic agent status: Secondary | ICD-10-CM | POA: Insufficient documentation

## 2015-05-08 DIAGNOSIS — I739 Peripheral vascular disease, unspecified: Secondary | ICD-10-CM | POA: Insufficient documentation

## 2015-05-08 DIAGNOSIS — I509 Heart failure, unspecified: Secondary | ICD-10-CM | POA: Insufficient documentation

## 2015-05-08 HISTORY — PX: INSERTION OF MESH: SHX5868

## 2015-05-08 HISTORY — DX: Anxiety disorder, unspecified: F41.9

## 2015-05-08 HISTORY — DX: Gastro-esophageal reflux disease without esophagitis: K21.9

## 2015-05-08 HISTORY — PX: UMBILICAL HERNIA REPAIR: SHX196

## 2015-05-08 SURGERY — REPAIR, HERNIA, UMBILICAL, ADULT
Anesthesia: General | Site: Abdomen

## 2015-05-08 MED ORDER — EPHEDRINE SULFATE 50 MG/ML IJ SOLN
INTRAMUSCULAR | Status: DC | PRN
Start: 2015-05-08 — End: 2015-05-08
  Administered 2015-05-08: 10 mg via INTRAVENOUS

## 2015-05-08 MED ORDER — SCOPOLAMINE 1 MG/3DAYS TD PT72
1.0000 | MEDICATED_PATCH | Freq: Once | TRANSDERMAL | Status: DC | PRN
Start: 2015-05-08 — End: 2015-05-08

## 2015-05-08 MED ORDER — LIDOCAINE HCL (CARDIAC) 20 MG/ML IV SOLN
INTRAVENOUS | Status: DC | PRN
  Administered 2015-05-08: 50 mg via INTRAVENOUS

## 2015-05-08 MED ORDER — DEXAMETHASONE SODIUM PHOSPHATE 4 MG/ML IJ SOLN
INTRAMUSCULAR | Status: DC | PRN
  Administered 2015-05-08: 10 mg via INTRAVENOUS

## 2015-05-08 MED ORDER — ONDANSETRON 4 MG PO TBDP
4.0000 mg | ORAL_TABLET | Freq: Three times a day (TID) | ORAL | Status: DC | PRN
Start: 2015-05-08 — End: 2015-06-18

## 2015-05-08 MED ORDER — SUCCINYLCHOLINE CHLORIDE 20 MG/ML IJ SOLN
INTRAMUSCULAR | Status: DC | PRN
Start: 2015-05-08 — End: 2015-05-08
  Administered 2015-05-08: 100 mg via INTRAVENOUS

## 2015-05-08 MED ORDER — MEPERIDINE HCL 25 MG/ML IJ SOLN: 6.2500 mg | INTRAMUSCULAR | Status: DC | PRN

## 2015-05-08 MED ORDER — CEFAZOLIN SODIUM-DEXTROSE 2-3 GM-% IV SOLR
INTRAVENOUS | Status: AC
  Filled 2015-05-08: qty 50

## 2015-05-08 MED ORDER — HYDROMORPHONE HCL 1 MG/ML IJ SOLN
INTRAMUSCULAR | Status: AC
  Filled 2015-05-08: qty 1

## 2015-05-08 MED ORDER — OXYCODONE-ACETAMINOPHEN 5-325 MG PO TABS: 1.0000 | ORAL_TABLET | ORAL | Status: DC | PRN

## 2015-05-08 MED ORDER — MIDAZOLAM HCL 2 MG/2ML IJ SOLN
INTRAMUSCULAR | Status: AC
  Filled 2015-05-08: qty 4

## 2015-05-08 MED ORDER — FENTANYL CITRATE (PF) 100 MCG/2ML IJ SOLN
50.0000 ug | INTRAMUSCULAR | Status: DC | PRN
  Administered 2015-05-08: 50 ug via INTRAVENOUS

## 2015-05-08 MED ORDER — OXYCODONE HCL 5 MG/5ML PO SOLN: 5.0000 mg | Freq: Once | ORAL | Status: DC | PRN

## 2015-05-08 MED ORDER — GLYCOPYRROLATE 0.2 MG/ML IJ SOLN: 0.2000 mg | Freq: Once | INTRAMUSCULAR | Status: DC | PRN

## 2015-05-08 MED ORDER — LIDOCAINE HCL (CARDIAC) 20 MG/ML IV SOLN
INTRAVENOUS | Status: AC
  Filled 2015-05-08: qty 5

## 2015-05-08 MED ORDER — ONDANSETRON HCL 4 MG/2ML IJ SOLN
INTRAMUSCULAR | Status: DC | PRN
  Administered 2015-05-08: 4 mg via INTRAVENOUS

## 2015-05-08 MED ORDER — OXYCODONE HCL 5 MG PO TABS: 5.0000 mg | ORAL_TABLET | Freq: Once | ORAL | Status: DC | PRN

## 2015-05-08 MED ORDER — HYDROMORPHONE HCL 1 MG/ML IJ SOLN
0.2500 mg | INTRAMUSCULAR | Status: DC | PRN
  Administered 2015-05-08 (×2): 0.5 mg via INTRAVENOUS

## 2015-05-08 MED ORDER — DEXAMETHASONE SODIUM PHOSPHATE 10 MG/ML IJ SOLN
INTRAMUSCULAR | Status: AC
  Filled 2015-05-08: qty 1

## 2015-05-08 MED ORDER — ONDANSETRON HCL 4 MG/2ML IJ SOLN
INTRAMUSCULAR | Status: AC
  Filled 2015-05-08: qty 2

## 2015-05-08 MED ORDER — DEXTROSE 5 % IV SOLN
3.0000 g | INTRAVENOUS | Status: AC
  Administered 2015-05-08: 2 g via INTRAVENOUS

## 2015-05-08 MED ORDER — LACTATED RINGERS IV SOLN
INTRAVENOUS | Status: DC
  Administered 2015-05-08 (×2): via INTRAVENOUS

## 2015-05-08 MED ORDER — BUPIVACAINE-EPINEPHRINE 0.25% -1:200000 IJ SOLN
INTRAMUSCULAR | Status: DC | PRN
  Administered 2015-05-08: 9 mL

## 2015-05-08 MED ORDER — FENTANYL CITRATE (PF) 100 MCG/2ML IJ SOLN
INTRAMUSCULAR | Status: AC
  Filled 2015-05-08: qty 4

## 2015-05-08 MED ORDER — PROPOFOL 500 MG/50ML IV EMUL
INTRAVENOUS | Status: AC
  Filled 2015-05-08: qty 50

## 2015-05-08 MED ORDER — PHENYLEPHRINE HCL 10 MG/ML IJ SOLN
INTRAMUSCULAR | Status: DC | PRN
  Administered 2015-05-08: 80 ug via INTRAVENOUS
  Administered 2015-05-08: 40 ug via INTRAVENOUS

## 2015-05-08 MED ORDER — CEFAZOLIN SODIUM 1-5 GM-% IV SOLN
INTRAVENOUS | Status: AC
  Filled 2015-05-08: qty 50

## 2015-05-08 MED ORDER — CHLORHEXIDINE GLUCONATE 4 % EX LIQD: 1.0000 "application " | Freq: Once | CUTANEOUS | Status: DC

## 2015-05-08 MED ORDER — SUCCINYLCHOLINE CHLORIDE 20 MG/ML IJ SOLN
INTRAMUSCULAR | Status: AC
  Filled 2015-05-08: qty 1

## 2015-05-08 MED ORDER — PROPOFOL 10 MG/ML IV BOLUS
INTRAVENOUS | Status: DC | PRN
  Administered 2015-05-08: 200 mg via INTRAVENOUS

## 2015-05-08 MED ORDER — MIDAZOLAM HCL 2 MG/2ML IJ SOLN
1.0000 mg | INTRAMUSCULAR | Status: DC | PRN
  Administered 2015-05-08: 2 mg via INTRAVENOUS

## 2015-05-08 SURGICAL SUPPLY — 49 items
APL SKNCLS STERI-STRIP NONHPOA (GAUZE/BANDAGES/DRESSINGS) ×1
BENZOIN TINCTURE PRP APPL 2/3 (GAUZE/BANDAGES/DRESSINGS) ×1 IMPLANT
BLADE CLIPPER SURG (BLADE) IMPLANT
BLADE SURG 10 STRL SS (BLADE) IMPLANT
BLADE SURG 15 STRL LF DISP TIS (BLADE) ×1 IMPLANT
BLADE SURG 15 STRL SS (BLADE) ×2
CANISTER SUCT 1200ML W/VALVE (MISCELLANEOUS) IMPLANT
CHLORAPREP W/TINT 26ML (MISCELLANEOUS) ×2 IMPLANT
COVER BACK TABLE 60X90IN (DRAPES) ×2 IMPLANT
COVER MAYO STAND STRL (DRAPES) ×2 IMPLANT
DECANTER SPIKE VIAL GLASS SM (MISCELLANEOUS) IMPLANT
DRAPE LAPAROTOMY TRNSV 102X78 (DRAPE) ×2 IMPLANT
DRAPE UTILITY XL STRL (DRAPES) ×2 IMPLANT
DRSG TEGADERM 4X4.75 (GAUZE/BANDAGES/DRESSINGS) IMPLANT
ELECT COATED BLADE 2.86 ST (ELECTRODE) ×2 IMPLANT
ELECT REM PT RETURN 9FT ADLT (ELECTROSURGICAL) ×2
ELECTRODE REM PT RTRN 9FT ADLT (ELECTROSURGICAL) ×1 IMPLANT
GLOVE BIO SURGEON STRL SZ 6.5 (GLOVE) ×1 IMPLANT
GLOVE BIOGEL PI IND STRL 7.0 (GLOVE) IMPLANT
GLOVE BIOGEL PI IND STRL 8 (GLOVE) ×1 IMPLANT
GLOVE BIOGEL PI INDICATOR 7.0 (GLOVE) ×2
GLOVE BIOGEL PI INDICATOR 8 (GLOVE) ×1
GLOVE ECLIPSE 6.5 STRL STRAW (GLOVE) ×1 IMPLANT
GLOVE ECLIPSE 8.0 STRL XLNG CF (GLOVE) ×2 IMPLANT
GOWN STRL REUS W/ TWL LRG LVL3 (GOWN DISPOSABLE) ×2 IMPLANT
GOWN STRL REUS W/TWL LRG LVL3 (GOWN DISPOSABLE) ×6
LIQUID BAND (GAUZE/BANDAGES/DRESSINGS) ×2 IMPLANT
MESH VENTRALEX ST 8CM LRG (Mesh General) ×1 IMPLANT
NDL HYPO 25X1 1.5 SAFETY (NEEDLE) ×1 IMPLANT
NEEDLE HYPO 22GX1.5 SAFETY (NEEDLE) IMPLANT
NEEDLE HYPO 25X1 1.5 SAFETY (NEEDLE) ×2 IMPLANT
NS IRRIG 1000ML POUR BTL (IV SOLUTION) IMPLANT
PACK BASIN DAY SURGERY FS (CUSTOM PROCEDURE TRAY) ×2 IMPLANT
PENCIL BUTTON HOLSTER BLD 10FT (ELECTRODE) ×2 IMPLANT
SLEEVE SCD COMPRESS KNEE MED (MISCELLANEOUS) ×2 IMPLANT
STAPLER VISISTAT 35W (STAPLE) IMPLANT
STRIP CLOSURE SKIN 1/2X4 (GAUZE/BANDAGES/DRESSINGS) IMPLANT
SUT MON AB 4-0 PC3 18 (SUTURE) ×2 IMPLANT
SUT NOVA NAB DX-16 0-1 5-0 T12 (SUTURE) ×3 IMPLANT
SUT NOVA NAB GS-22 2 0 T19 (SUTURE) IMPLANT
SUT SILK 3 0 SH 30 (SUTURE) IMPLANT
SUT VIC AB 2-0 SH 27 (SUTURE) ×2
SUT VIC AB 2-0 SH 27XBRD (SUTURE) ×1 IMPLANT
SUT VICRYL 3-0 CR8 SH (SUTURE) ×2 IMPLANT
SYR CONTROL 10ML LL (SYRINGE) ×2 IMPLANT
TOWEL OR 17X24 6PK STRL BLUE (TOWEL DISPOSABLE) ×2 IMPLANT
TOWEL OR NON WOVEN STRL DISP B (DISPOSABLE) ×2 IMPLANT
TUBE CONNECTING 20X1/4 (TUBING) IMPLANT
YANKAUER SUCT BULB TIP NO VENT (SUCTIONS) IMPLANT

## 2015-05-08 NOTE — Anesthesia Preprocedure Evaluation (Signed)
Anesthesia Evaluation  Patient identified by MRN, date of birth, ID band Patient awake    Reviewed: Allergy & Precautions, NPO status , Patient's Chart, lab work & pertinent test results  Airway Mallampati: II  TM Distance: >3 FB Neck ROM: Full    Dental  (+) Teeth Intact, Upper Dentures, Dental Advisory Given   Pulmonary Current Smoker,    breath sounds clear to auscultation       Cardiovascular hypertension, Pt. on medications + Peripheral Vascular Disease and +CHF   Rhythm:Regular Rate:Normal     Neuro/Psych    GI/Hepatic   Endo/Other    Renal/GU      Musculoskeletal   Abdominal   Peds  Hematology   Anesthesia Other Findings   Reproductive/Obstetrics                             Anesthesia Physical Anesthesia Plan  ASA: III  Anesthesia Plan: General   Post-op Pain Management:    Induction:   Airway Management Planned: Oral ETT  Additional Equipment:   Intra-op Plan:   Post-operative Plan: Extubation in OR  Informed Consent: I have reviewed the patients History and Physical, chart, labs and discussed the procedure including the risks, benefits and alternatives for the proposed anesthesia with the patient or authorized representative who has indicated his/her understanding and acceptance.   Dental advisory given  Plan Discussed with: CRNA, Anesthesiologist and Surgeon  Anesthesia Plan Comments:         Anesthesia Quick Evaluation

## 2015-05-08 NOTE — H&P (Signed)
H&P   [name] (MR# [id number])      H&P Info    Author Note Status Last Update User Last Update Date/Time   Erroll Luna, MD Signed Erroll Luna, MD     H&P    Expand All Collapse All   [name]   Location: [facility] Patient #: [id number] DOB: 03/31/1966 Married / Language: English / Race: White Male History of Present Illness ( ) Patient words: new-lump on thigh  pt here for an umbilical hernia. Pt sent at the request of [name] for umbilical hernia. He has a history of multiple lipoma as well. He has one on his posterior thigh but that is not why he is here. The hernia is at his belly button and pokes out. It is getting larger and causing mild discomfort. It pops in and out. No nausea vomiting or issues with his bowels. Smokes intermittently. Lipoma over his posterior thigh causes mild discomfort with sitting. It has been present for years.  The patient is a 49 year old male   Allergies ([name]; Codeine Sulfate *ANALGESICS - OPIOID*  Medication History ([name]; Diazepam (5MG  Tablet, Oral) Active. Aspirin (325MG  Tablet, Oral) Active. Atorvastatin Calcium (40MG  Tablet, Oral) Active. Benztropine Mesylate (1MG  Tablet, Oral) Active. Pantoprazole Sodium (40MG  Tablet DR, Oral) Active. Fenofibrate (54MG  Tablet, Oral) Active. Naltrexone HCl (50MG  Tablet, Oral) Active. Medications Reconciled    Vitals ([name] 02/24/2015 10:51 AM Weight: 241.6 lb Height: 65in Body Surface Area: 2.24 m Body Mass Index: 40.2 kg/m Temp.: 98.52F(Oral)  Pulse: 82 (Regular)  BP: 136/80 (Sitting, Left Arm, Standard)     Physical Exam ([name];  General Mental Status-Alert. General Appearance-Consistent with stated age. Hydration-Well hydrated. Voice-Normal.  Integumentary Note: 3 cm right thigh mass/ lipoma mobile mildly irritated. right forearm lipoma 3 cm mobile   Chest and  Lung Exam Chest and lung exam reveals -quiet, even and easy respiratory effort with no use of accessory muscles and on auscultation, normal breath sounds, no adventitious sounds and normal vocal resonance. Inspection Chest Wall - Normal. Back - normal.  Cardiovascular Cardiovascular examination reveals -on palpation PMI is normal in location and amplitude, no palpable S3 or S4. Normal cardiac borders., normal heart sounds, regular rate and rhythm with no murmurs, carotid auscultation reveals no bruits and normal pedal pulses bilaterally.  Abdomen Note: reducible moderate size umbilical hernia soft reducible 2 cm obese soft non tender   Neurologic Neurologic evaluation reveals -alert and oriented x 3 with no impairment of recent or remote memory. Mental Status-Normal.  Musculoskeletal Normal Exam - Left-Upper Extremity Strength Normal and Lower Extremity Strength Normal. Normal Exam - Right-Upper Extremity Strength Normal, Lower Extremity Weakness.    Assessment & Plan ([name];)  UMBILICAL HERNIA WITHOUT OBSTRUCTION AND WITHOUT GANGRENE (553.1  K42.9) Impression: discussed repair and smoking cessation. Discussed risks of complications with smoking and how smoking increases complications. He does not wish any treatment for lipoma currently. The risk of hernia repair include bleeding, infection, organ injury, bowel injury, bladder injury, nerve injury recurrent hernia, blood clots, worsening of underlying condition, chronic pain, mesh use, open surgery, death, and the need for other operattions. Pt agrees to proceed  Current Plans You are being scheduled for surgery - Our schedulers will call you.  You should hear from our office's scheduling department within 5 working days about the location, date, and time of surgery. We try to make accommodations for patient's preferences in scheduling surgery, but sometimes  the OR schedule or the surgeon's schedule prevents  [place] from making those accommodations.  If you have not heard from our office [phone]) in 5 working days, call the office and ask for your surgeon's nurse.  If you have other questions about your diagnosis, plan, or surgery, call the office and ask for your surgeon's nurse. Written instructions provided   The anatomy & physiology of the abdominal wall was discussed. The pathophysiology of hernias was discussed. Natural history risks without surgery including progeressive enlargement, pain, incarceration, & strangulation was discussed. Contributors to complications such as smoking, obesity, diabetes, prior surgery, etc were discussed.  I feel the risks of no intervention will lead to serious problems that outweigh the operative risks; therefore, I recommended surgery to reduce and repair the hernia. I explained laparoscopic techniques with possible need for an open approach. I noted the probable use of mesh to patch and/or buttress the hernia repair  Risks such as bleeding, infection, abscess, need for further treatment, heart attack, death, and other risks were discussed. I noted a good likelihood this will help address the problem. Goals of post-operative recovery were discussed as well. Possibility that this will not correct all symptoms was explained. I stressed the importance of low-impact activity, aggressive pain control, avoiding constipation, & not pushing through pain to minimize risk of post-operative chronic pain or injury. Possibility of reherniation especially with smoking, obesity, diabetes, immunosuppression, and other health conditions was discussed. We will work to minimize complications.  An educational handout further explaining the pathology & treatment options was given as well. Questions were answered. The patient expresses understanding & wishes to proceed with surgery. Pt Education - [facility] Hernia Post-Op HCI (Gross): discussed with patient and provided information. Pt  Education - [facility] Pain Control (Gross) Pt Education - Hernia: discussed with patient and provided information.    LIPOMA (214.9  D17.9) Impression: observe for now.  right thigh and left forearm

## 2015-05-08 NOTE — Interval H&P Note (Signed)
History and Physical Interval Note:  05/08/2015 11:55 AM  [name]  has presented today for surgery, with the diagnosis of Umbilical Hernia  The various methods of treatment have been discussed with the patient and family. After consideration of risks, benefits and other options for treatment, the patient has consented to  Procedure(s): UMBILICAL HERNIA REPAIR WITH MESH (N/A) as a surgical intervention .  The patient's history has been reviewed, patient examined, no change in status, stable for surgery.  I have reviewed the patient's chart and labs.  Questions were answered to the patient's satisfaction.     [name] A.

## 2015-05-08 NOTE — Discharge Instructions (Signed)
[facility] _______[facility] ° °UMBILICAL OR INGUINAL HERNIA REPAIR: POST OP INSTRUCTIONS ° °Always review your discharge instruction sheet given to you by the facility where your surgery was performed. °IF YOU HAVE DISABILITY OR FAMILY LEAVE FORMS, YOU MUST BRING THEM TO THE OFFICE FOR PROCESSING.   °DO NOT GIVE THEM TO YOUR DOCTOR. ° °1. A  prescription for pain medication may be given to you upon discharge.  Take your pain medication as prescribed, if needed.  If narcotic pain medicine is not needed, then you may take acetaminophen (Tylenol) or ibuprofen (Advil) as needed. °2. Take your usually prescribed medications unless otherwise directed. °3. If you need a refill on your pain medication, please contact your pharmacy.  They will contact our office to request authorization. Prescriptions will not be filled after 5 pm or on week-ends. °4. You should follow a light diet the first 24 hours after arrival home, such as soup and crackers, etc.  Be sure to include lots of fluids daily.  Resume your normal diet the day after surgery. °5. Most patients will experience some swelling and bruising around the umbilicus or in the groin and scrotum.  Ice packs and reclining will help.  Swelling and bruising can take several days to resolve.  °6. It is common to experience some constipation if taking pain medication after surgery.  Increasing fluid intake and taking a stool softener (such as Colace) will usually help or prevent this problem from occurring.  A mild laxative (Milk of Magnesia or Miralax) should be taken according to package directions if there are no bowel movements after 48 hours. °7. Unless discharge instructions indicate otherwise, you may remove your bandages 24-48 hours after surgery, and you may shower at that time.  You may have steri-strips (small skin tapes) in place directly over the incision.  These strips should be left on the skin for 7-10 days.  If your surgeon used skin glue on the  incision, you may shower in 24 hours.  The glue will flake off over the next 2-3 weeks.  Any sutures or staples will be removed at the office during your follow-up visit. °8. ACTIVITIES:  You may resume regular (light) daily activities beginning the next day--such as daily self-care, walking, climbing stairs--gradually increasing activities as tolerated.  You may have sexual intercourse when it is comfortable.  Refrain from any heavy lifting or straining until approved by your doctor. °a. You may drive when you are no longer taking prescription pain medication, you can comfortably wear a seatbelt, and you can safely maneuver your car and apply brakes. °b. RETURN TO WORK:  __________________________________________________________ °9. You should see your doctor in the office for a follow-up appointment approximately 2-3 weeks after your surgery.  Make sure that you call for this appointment within a day or two after you arrive home to insure a convenient appointment time. °10. OTHER INSTRUCTIONS:  __________________________________________________________________________________________________________________________________________________________________________________________  °WHEN TO CALL YOUR DOCTOR: °1. Fever over 101.0 °2. Inability to urinate °3. Nausea and/or vomiting °4. Extreme swelling or bruising °5. Continued bleeding from incision. °6. Increased pain, redness, or drainage from the incision ° °The clinic staff is available to answer your questions during regular business hours.  Please don’t hesitate to call and ask to speak to one of the nurses for clinical concerns.  If you have a medical emergency, go to the nearest emergency room or call 911.  A surgeon from [facility] is always on call at the hospital ° ° °  [address], [address], [place], [place]  [address] ? ° [address] [address], , [place]   [address] °[phone] ? [phone] ? FAX [phone] °Web site:  [email or web address] ° °Post Anesthesia Home Care Instructions ° °Activity: °Get plenty of rest for the remainder of the day. A responsible adult should stay with you for 24 hours following the procedure.  °For the next 24 hours, DO NOT: °-Drive a car °-Operate machinery °-Drink alcoholic beverages °-Take any medication unless instructed by your physician °-Make any legal decisions or sign important papers. ° °Meals: °Start with liquid foods such as gelatin or soup. Progress to regular foods as tolerated. Avoid greasy, spicy, heavy foods. If nausea and/or vomiting occur, drink only clear liquids until the nausea and/or vomiting subsides. Call your physician if vomiting continues. ° °Special Instructions/Symptoms: °Your throat may feel dry or sore from the anesthesia or the breathing tube placed in your throat during surgery. If this causes discomfort, gargle with warm salt water. The discomfort should disappear within 24 hours. ° °If you had a scopolamine patch placed behind your ear for the management of post- operative nausea and/or vomiting: ° °1. The medication in the patch is effective for 72 hours, after which it should be removed.  Wrap patch in a tissue and discard in the trash. Wash hands thoroughly with soap and water. °2. You may remove the patch earlier than 72 hours if you experience unpleasant side effects which may include dry mouth, dizziness or visual disturbances. °3. Avoid touching the patch. Wash your hands with soap and water after contact with the patch. °  ° °

## 2015-05-08 NOTE — Op Note (Signed)
[name] 05-31-1966 [id number] 05/08/2015  Preoperative diagnosis: umbilical hernia reducuble  Postoperative diagnosis: same  Procedure: Umbilical Hernia Repair with 8 cm ventralight coated mesh   Surgeon: Erroll Luna, MD, FACS  Anesthesia: General and 0.25 % local with epinephrine    Clinical History and Indications: Pt presents for repair of umbilical hernia.  The risk of hernia repair include bleeding,  Infection,   Recurrence of the hernia,  Mesh use, chronic pain,  Organ injury,  Bowel injury,  Bladder injury,   nerve injury with numbness around the incision,  Death,  and worsening of preexisting  medical problems.  The alternatives to surgery have been discussed as well..  Long term expectations of both operative and non operative treatments have been discussed.   The patient agrees to proceed.  Procedure: The patient was seen in the preoperative area and the plans for the procedure reviewed again. He  had no further questions. I marked the area of the umbilicus as the operative site. He wishes to prodeed.  The patient was taken to the operating room and after satisfactory general  anesthesia had been obtained the area was clipped as needed, prepped and draped. The timeout was performed.  I used some local  anesthesia to help with postoperative pain management. This was infiltrated around the umbilical area and additional infiltrated as I worked.  A curvilinear incision was made on the inferior aspect of the umbilicus. The umbilical skin was elevated off of the hernia sac. The hernia sac was dissected free of the subcutaneous tissues.  The defect was dissected out and measured 3 cm  And contained preperitoneal fat.  A space was created under the fascia and an 8 cm round coated mesh was placed as an underlay.  0 novafil suture used to secure the mesh around the edge.  Care taken not to injury internal viscera.  The fascia was closed with 0 novafil.    Once the repair was  complete the incision was closed by using some 3-0 Vicryl subcutaneous and 4-0 Monocryl subcuticular sutures.  Liquid adhesive applied.   The patient tolerated the procedure well. There were no operative complications. There was minimal blood loss. All counts were correct. He was taken to the PACU in satisfactory condition.  Turner Daniels , MD, FACS 05/08/2015 1:23 PM

## 2015-05-08 NOTE — H&P (Signed)
H&P   Taran Haynesworth (MR# [id number])      H&P Info    Author Note Status Last Update User Last Update Date/Time   Erroll Luna, MD Signed Erroll Luna, MD 02/24/2015 12:53 PM    H&P    Expand All Collapse All   Dantonio Justen 02/24/2015 10:51 AM Location: [facility] Patient #: [id number] DOB: 1965-08-19 Married / Language: Cleophus Molt / Race: White Male History of Present Illness [name] A. Devontae Casasola MD; 02/24/2015 12:50 PM) Patient words: new-lump on thigh  pt here for an umbilical hernia. Pt sent at the request of [name] for umbilical hernia. He has a history of multiple lipoma as well. He has one on his posterior thigh but that is not why he is here. The hernia is at his belly button and pokes out. It is getting larger and causing mild discomfort. It pops in and out. No nausea vomiting or issues with his bowels. Smokes intermittently. Lipoma over his posterior thigh causes mild discomfort with sitting. It has been present for years.  The patient is a 49 year old male   Allergies Malachi Bonds; 02/24/2015 10:51 AM) Codeine Sulfate *ANALGESICS - OPIOID*  Medication History ([name]; 02/24/2015 10:53 AM) Diazepam (5MG  Tablet, Oral) Active. Aspirin (325MG  Tablet, Oral) Active. Atorvastatin Calcium (40MG  Tablet, Oral) Active. Benztropine Mesylate (1MG  Tablet, Oral) Active. Pantoprazole Sodium (40MG  Tablet DR, Oral) Active. Fenofibrate (54MG  Tablet, Oral) Active. Naltrexone HCl (50MG  Tablet, Oral) Active. Medications Reconciled    Vitals Malachi Bonds; 02/24/2015 10:51 AM) 02/24/2015 10:51 AM Weight: 241.6 lb Height: 65in Body Surface Area: 2.24 m Body Mass Index: 40.2 kg/m Temp.: 98.16F(Oral)  Pulse: 82 (Regular)  BP: 136/80 (Sitting, Left Arm, Standard)     Physical Exam (Tylene Quashie A. Alliyah Roesler MD; 02/24/2015 12:51 PM)  General Mental Status-Alert. General Appearance-Consistent with stated age. Hydration-Well  hydrated. Voice-Normal.  Integumentary Note: 3 cm right thigh mass/ lipoma mobile mildly irritated. right forearm lipoma 3 cm mobile   Chest and Lung Exam Chest and lung exam reveals -quiet, even and easy respiratory effort with no use of accessory muscles and on auscultation, normal breath sounds, no adventitious sounds and normal vocal resonance. Inspection Chest Wall - Normal. Back - normal.  Cardiovascular Cardiovascular examination reveals -on palpation PMI is normal in location and amplitude, no palpable S3 or S4. Normal cardiac borders., normal heart sounds, regular rate and rhythm with no murmurs, carotid auscultation reveals no bruits and normal pedal pulses bilaterally.  Abdomen Note: reducible moderate size umbilical hernia soft reducible 2 cm obese soft non tender   Neurologic Neurologic evaluation reveals -alert and oriented x 3 with no impairment of recent or remote memory. Mental Status-Normal.  Musculoskeletal Normal Exam - Left-Upper Extremity Strength Normal and Lower Extremity Strength Normal. Normal Exam - Right-Upper Extremity Strength Normal, Lower Extremity Weakness.    Assessment & Plan (Bernadett Milian A. Taneisha Fuson MD; 5/80/9983 38:25 PM)  UMBILICAL HERNIA WITHOUT OBSTRUCTION AND WITHOUT GANGRENE (553.1  K42.9) Impression: discussed repair and smoking cessation. Discussed risks of complications with smoking and how smoking increases complications. He does not wish any treatment for lipoma currently. The risk of hernia repair include bleeding, infection, organ injury, bowel injury, bladder injury, nerve injury recurrent hernia, blood clots, worsening of underlying condition, chronic pain, mesh use, open surgery, death, and the need for other operattions. Pt agrees to proceed  Current Plans You are being scheduled for surgery - Our schedulers will call you.  You should hear from our office's scheduling department within 5  working days about the  location, date, and time of surgery. We try to make accommodations for patient's preferences in scheduling surgery, but sometimes the OR schedule or the surgeon's schedule prevents [place] from making those accommodations.  If you have not heard from our office [phone]) in 5 working days, call the office and ask for your surgeon's nurse.  If you have other questions about your diagnosis, plan, or surgery, call the office and ask for your surgeon's nurse. Written instructions provided   The anatomy & physiology of the abdominal wall was discussed. The pathophysiology of hernias was discussed. Natural history risks without surgery including progeressive enlargement, pain, incarceration, & strangulation was discussed. Contributors to complications such as smoking, obesity, diabetes, prior surgery, etc were discussed.  I feel the risks of no intervention will lead to serious problems that outweigh the operative risks; therefore, I recommended surgery to reduce and repair the hernia. I explained laparoscopic techniques with possible need for an open approach. I noted the probable use of mesh to patch and/or buttress the hernia repair  Risks such as bleeding, infection, abscess, need for further treatment, heart attack, death, and other risks were discussed. I noted a good likelihood this will help address the problem. Goals of post-operative recovery were discussed as well. Possibility that this will not correct all symptoms was explained. I stressed the importance of low-impact activity, aggressive pain control, avoiding constipation, & not pushing through pain to minimize risk of post-operative chronic pain or injury. Possibility of reherniation especially with smoking, obesity, diabetes, immunosuppression, and other health conditions was discussed. We will work to minimize complications.  An educational handout further explaining the pathology & treatment options was given as well. Questions were  answered. The patient expresses understanding & wishes to proceed with surgery. Pt Education - [facility] Hernia Post-Op HCI (Gross): discussed with patient and provided information. Pt Education - [facility] Pain Control (Gross) Pt Education - Hernia: discussed with patient and provided information. LIPOMA (214.9  D17.9) Impression: observe for now.  right thigh and left forearm

## 2015-05-08 NOTE — Anesthesia Procedure Notes (Signed)
Procedure Name: Intubation Date/Time: 05/08/2015 12:25 PM Performed by: Lieutenant Diego Pre-anesthesia Checklist: Patient identified, Emergency Drugs available, Suction available and Patient being monitored Patient Re-evaluated:Patient Re-evaluated prior to inductionOxygen Delivery Method: Circle System Utilized Preoxygenation: Pre-oxygenation with 100% oxygen Intubation Type: IV induction Ventilation: Mask ventilation without difficulty Laryngoscope Size: Miller and 2 Grade View: Grade I Tube type: Oral Tube size: 7.0 mm Number of attempts: 1 Airway Equipment and Method: Stylet and Oral airway Placement Confirmation: ETT inserted through vocal cords under direct vision,  positive ETCO2 and breath sounds checked- equal and bilateral Secured at: 23 cm Tube secured with: Tape Dental Injury: Teeth and Oropharynx as per pre-operative assessment

## 2015-05-08 NOTE — Transfer of Care (Signed)
Immediate Anesthesia Transfer of Care Note  Patient: [name]  Procedure(s) Performed: Procedure(s): UMBILICAL HERNIA REPAIR WITH MESH (N/A) INSERTION OF MESH (N/A)  Patient Location: PACU  Anesthesia Type:General  Level of Consciousness: awake  Airway & Oxygen Therapy: Patient Spontanous Breathing and Patient connected to face mask oxygen  Post-op Assessment: Report given to RN and Post -op Vital signs reviewed and stable  Post vital signs: Reviewed and stable  Last Vitals:  Filed Vitals:   05/08/15 1121  BP: 133/78  Pulse: 82  Temp: 36.7 C  Resp: 22    Complications: No apparent anesthesia complications

## 2015-05-08 NOTE — Anesthesia Postprocedure Evaluation (Signed)
  Anesthesia Post-op Note  Patient: [name]  Procedure(s) Performed: Procedure(s): UMBILICAL HERNIA REPAIR WITH MESH (N/A) INSERTION OF MESH (N/A)  Patient Location: PACU  Anesthesia Type: General   Level of Consciousness: awake, alert  and oriented  Airway and Oxygen Therapy: Patient Spontanous Breathing  Post-op Pain: mild  Post-op Assessment: Post-op Vital signs reviewed  Post-op Vital Signs: Reviewed  Last Vitals:  Filed Vitals:   05/08/15 1443  BP: 157/91  Pulse: 88  Temp: 36.6 C  Resp:     Complications: No apparent anesthesia complications

## 2015-05-09 ENCOUNTER — Encounter (HOSPITAL_BASED_OUTPATIENT_CLINIC_OR_DEPARTMENT_OTHER): Payer: Self-pay | Admitting: Surgery

## 2015-05-16 ENCOUNTER — Encounter: Payer: Self-pay | Admitting: Internal Medicine

## 2015-05-25 ENCOUNTER — Other Ambulatory Visit: Payer: Self-pay | Admitting: Medical

## 2015-05-30 LAB — CUP PACEART REMOTE DEVICE CHECK: Date Time Interrogation Session: 20161026213605

## 2015-05-30 NOTE — Progress Notes (Signed)
Carelink summary report received. Battery status OK. Normal device function. No new symptom episodes, tachy episodes, brady, or pause episodes. No new AF episodes. Monthly summary reports and ROV with SK in 11/2015. 

## 2015-06-02 ENCOUNTER — Ambulatory Visit (INDEPENDENT_AMBULATORY_CARE_PROVIDER_SITE_OTHER): Payer: Managed Care, Other (non HMO) | Admitting: *Deleted

## 2015-06-02 DIAGNOSIS — I639 Cerebral infarction, unspecified: Secondary | ICD-10-CM | POA: Diagnosis not present

## 2015-06-04 NOTE — Progress Notes (Signed)
LOOP RECORDER  

## 2015-06-09 ENCOUNTER — Encounter: Payer: Self-pay | Admitting: Medical

## 2015-06-09 ENCOUNTER — Ambulatory Visit (INDEPENDENT_AMBULATORY_CARE_PROVIDER_SITE_OTHER): Payer: Managed Care, Other (non HMO) | Admitting: Medical

## 2015-06-09 VITALS — BP 118/80 | HR 92 | Wt 232.0 lb

## 2015-06-09 DIAGNOSIS — Z0000 Encounter for general adult medical examination without abnormal findings: Secondary | ICD-10-CM | POA: Diagnosis not present

## 2015-06-09 DIAGNOSIS — Z8679 Personal history of other diseases of the circulatory system: Secondary | ICD-10-CM | POA: Diagnosis not present

## 2015-06-09 DIAGNOSIS — Z8673 Personal history of transient ischemic attack (TIA), and cerebral infarction without residual deficits: Secondary | ICD-10-CM | POA: Diagnosis not present

## 2015-06-09 DIAGNOSIS — M545 Low back pain, unspecified: Secondary | ICD-10-CM

## 2015-06-09 DIAGNOSIS — R0789 Other chest pain: Secondary | ICD-10-CM

## 2015-06-09 DIAGNOSIS — E785 Hyperlipidemia, unspecified: Secondary | ICD-10-CM | POA: Diagnosis not present

## 2015-06-09 DIAGNOSIS — Z1211 Encounter for screening for malignant neoplasm of colon: Secondary | ICD-10-CM | POA: Diagnosis not present

## 2015-06-09 DIAGNOSIS — Z2820 Immunization not carried out because of patient decision for unspecified reason: Secondary | ICD-10-CM | POA: Diagnosis not present

## 2015-06-09 DIAGNOSIS — Z125 Encounter for screening for malignant neoplasm of prostate: Secondary | ICD-10-CM | POA: Diagnosis not present

## 2015-06-09 DIAGNOSIS — F32A Depression, unspecified: Secondary | ICD-10-CM | POA: Insufficient documentation

## 2015-06-09 DIAGNOSIS — R202 Paresthesia of skin: Secondary | ICD-10-CM

## 2015-06-09 DIAGNOSIS — F17200 Nicotine dependence, unspecified, uncomplicated: Secondary | ICD-10-CM

## 2015-06-09 DIAGNOSIS — Z720 Tobacco use: Secondary | ICD-10-CM

## 2015-06-09 DIAGNOSIS — F329 Major depressive disorder, single episode, unspecified: Secondary | ICD-10-CM

## 2015-06-09 DIAGNOSIS — K219 Gastro-esophageal reflux disease without esophagitis: Secondary | ICD-10-CM

## 2015-06-09 DIAGNOSIS — I10 Essential (primary) hypertension: Secondary | ICD-10-CM | POA: Diagnosis not present

## 2015-06-09 DIAGNOSIS — G8929 Other chronic pain: Secondary | ICD-10-CM

## 2015-06-09 DIAGNOSIS — F1911 Other psychoactive substance abuse, in remission: Secondary | ICD-10-CM

## 2015-06-09 DIAGNOSIS — Z87898 Personal history of other specified conditions: Secondary | ICD-10-CM | POA: Diagnosis not present

## 2015-06-09 NOTE — Progress Notes (Signed)
Subjective:   HPI  [name] is a 49 y.o. male who presents for a complete physical, multiple issues.   Medical care team includes:  Dr. Toy Care, psychiatry  Dr. [name], electrophysiology [place], [place], PA-C here for primary care Neurology, s/p stroke 2016  Advanced directive: Health care power of attorney: no Living will: no  Concerns: Multiple concerns  hyperlipidemia - compliant with medication except the 2 days per week he drinks alcohol Hx/o CHF and stroke - taking aspirin, cholesterol medication.  Exercising some.  Somewhat careful with diet Has ongoing burning sensation in both feet. Hx/o low back pain, prior back surgery years ago.   Has hx/o polysubstance abuse, mostly alcohol and still drinks at least 2 days per week Father passed away 2 months ago, so he drank a lot during that time Sees psychiatry for depression and bipolar  He was upset initially today as he thought we were doing physical even thought visit wasn't scheduled as such, but we turned it into a physical.   Reviewed their medical, surgical, family, social, medication, and allergy history and updated chart as appropriate.  Past Medical History  Diagnosis Date  . Depression   . Congenital heart disease   . Back pain   . History of ETOH abuse   . CHF (congestive heart failure) ([place])   . Essential hypertension   . CVA (cerebral vascular accident) ([place])     embolic stroke [id number]  . Congenital heart disease     ? self reported valve issue  . Bipolar depression ([place])   . Substance abuse   . Anxiety   . GERD (gastroesophageal reflux disease)   . Headache     Past Surgical History  Procedure Laterality Date  . Cardiac catheterization      at age 64  . Back surgery Bilateral 1996  . Skin graft    . Carpal tunnel release    . Loop recorder implant N/A 11/01/2014    Procedure: LOOP RECORDER IMPLANT;  Surgeon: Deboraha Sprang, MD;  Location: [facility] CATH LAB;  Service: Cardiovascular;  Laterality:  N/A;  . Tee without cardioversion N/A 11/01/2014    Procedure: TRANSESOPHAGEAL ECHOCARDIOGRAM (TEE);  Surgeon: Josue Hector, MD;  Location: [facility] ENDOSCOPY;  Service: Cardiovascular;  Laterality: N/A;  . Umbilical hernia repair N/A 05/08/2015    Procedure: UMBILICAL HERNIA REPAIR WITH MESH;  Surgeon: Erroll Luna, MD;  Location: [place];  Service: General;  Laterality: N/A;  . Insertion of mesh N/A 05/08/2015    Procedure: INSERTION OF MESH;  Surgeon: Erroll Luna, MD;  Location: [place];  Service: General;  Laterality: N/A;    Social History   Social History  . Marital Status: Married    Spouse Name: N/A  . Number of Children: 2  . Years of Education: 8   Occupational History  . Unemployed    Social History Main Topics  . Smoking status: Current Some Day Smoker -- 0.50 packs/day    Types: Cigarettes  . Smokeless tobacco: Never Used     Comment: Says he smokes more when he is drinking.  . Alcohol Use: 0.0 oz/week    0 Standard drinks or equivalent per week     Comment: daily beer drinker  . Drug Use: Yes    Special: Marijuana     Comment: last smoked about 1-2 mos ago  . Sexual Activity: Not on file   Other Topics Concern  . Not on file   Social  History Narrative   Lives at home with wife.   Right-handed.   2 cups caffeine daily.    Family History  Problem Relation Age of Onset  . Cancer Maternal Grandmother   . Cancer Paternal Grandfather     lymphoma  . Arthritis Mother   . Hearing loss Mother   . Heart block Father      Current outpatient prescriptions:  .  aspirin 325 MG tablet, Take 1 tablet (325 mg total) by mouth daily., Disp: 90 tablet, Rfl: 3 .  atorvastatin (LIPITOR) 40 MG tablet, TAKE 1 TABLET BY MOUTH EVERY DAY AT 6 PM, Disp: 30 tablet, Rfl: 0 .  benztropine (COGENTIN) 1 MG tablet, Take 1 mg by mouth 2 (two) times daily., Disp: , Rfl:  .  diazepam (VALIUM) 5 MG tablet, Take 5 mg by mouth every 12 (twelve) hours  as needed for anxiety., Disp: , Rfl:  .  divalproex (DEPAKOTE ER) 500 MG 24 hr tablet, Take 1,000 mg by mouth 2 (two) times daily. , Disp: , Rfl:  .  fenofibrate 54 MG tablet, TAKE 1 TABLET BY MOUTH EVERY DAY, Disp: 30 tablet, Rfl: 0 .  pantoprazole (PROTONIX) 40 MG tablet, TAKE 1 TABLET BY MOUTH EVERY DAY, Disp: 30 tablet, Rfl: 0 .  Asenapine Maleate (SAPHRIS SL), Place 1 tablet under the tongue as needed. , Disp: , Rfl:  .  ondansetron (ZOFRAN ODT) 4 MG disintegrating tablet, Take 1 tablet (4 mg total) by mouth every 8 (eight) hours as needed for nausea or vomiting. (Patient not taking: Reported on 06/09/2015), Disp: 20 tablet, Rfl: 0 .  oxyCODONE-acetaminophen (ROXICET) 5-325 MG tablet, Take 1-2 tablets by mouth every 4 (four) hours as needed. (Patient not taking: Reported on 06/09/2015), Disp: 30 tablet, Rfl: 0  Allergies  Allergen Reactions  . Codeine Nausea And Vomiting    Review of Systems Constitutional: -fever, -chills, -sweats, -unexpected weight change, -decreased appetite, -fatigue Allergy: -sneezing, -itching, -congestion Dermatology: -changing moles, --rash, -lumps ENT: -runny nose, -ear pain, -sore throat, -hoarseness, -sinus pain, -teeth pain, - ringing in ears, -hearing loss, -nosebleeds Cardiology: -chest pain, -palpitations, -swelling, -difficulty breathing when lying flat, -waking up short of breath Respiratory: -cough, -shortness of breath, -difficulty breathing with exercise or exertion, -wheezing, -coughing up blood Gastroenterology: -abdominal pain, -nausea, -vomiting, -diarrhea, -constipation, -blood in stool, -changes in bowel movement, -difficulty swallowing or eating Hematology: -bleeding, -bruising  Musculoskeletal: -joint aches, -muscle aches, -joint swelling, -back pain, -neck pain, -cramping, -changes in gait Ophthalmology: denies vision changes, eye redness, itching, discharge Urology: -burning with urination, -difficulty urinating, -blood in urine, -urinary  frequency, -urgency, -incontinence Neurology: -headache, -weakness, -tingling, -numbness, -memory loss, -falls, -dizziness Psychology: -depressed mood, -agitation, -sleep problems     Objective:   Physical Exam BP 118/80 mmHg  Pulse 92  Wt 232 lb (105.235 kg)  General appearance: alert, no distress, WD/WN, obese white male Skin: several scattered macules throughout, some crusted small flat lesions of hands, likely AKs, no other worrisome lesions HEENT: normocephalic, conjunctiva/corneas normal, sclerae anicteric, PERRLA, EOMi, nares patent, no discharge or erythema, pharynx normal Oral cavity: MMM, tongue normal, teeth - upper denture, lower partial plate, otherwise in good repair Neck: supple, no lymphadenopathy, no thyromegaly, no masses, normal ROM, no bruits Chest: non tender, normal shape and expansion Heart: RRR, normal S1, S2, no murmurs Lungs: CTA bilaterally, no wheezes, rhonchi, or rales Abdomen: +bs, soft, healing scar of umbilicus from recent surgery, otherwise non tender, non distended, no masses, no hepatomegaly, no splenomegaly, no  bruits Back: lumbar surgical scar, otherwise non tender, normal ROM, no scoliosis Musculoskeletal: feet with mild tenderness of heels bilat, otherwise upper extremities non tender, no obvious deformity, normal ROM throughout, lower extremities non tender, no obvious deformity, normal ROM throughout Extremities: no edema, no cyanosis, no clubbing Pulses: 2+ symmetric, upper and lower extremities, normal cap refill Neurological: +decreased sensations somewhat to light touch and monofilament, otherwise alert, oriented x 3, CN2-12 intact, strength normal upper extremities and lower extremities, sensation normal throughout, DTRs 2+ throughout, no cerebellar signs, gait normal Psychiatric: normal affect, behavior normal, pleasant  GU: normal male external genitalia, circumcised, nontender, no masses, no hernia, no lymphadenopathy Rectal: anus normal  tone, prostate WNL, no nodules    Adult ECG Report  Indication: chest discomfort  Rate: 82 bpm  Rhythm: normal sinus rhythm  QRS Axis: 10 degrees  PR Interval: 18ms  QRS Duration: 86ms  QTc: 435ms  Conduction Disturbances: none  Other Abnormalities: none  Patient's cardiac risk factors are: dyslipidemia, family history of premature cardiovascular disease, hypertension, male gender, obesity (BMI >= 30 kg/m2) and smoking/ tobacco exposure.  EKG comparison: 2015  Narrative Interpretation: no change, normal EKG    Assessment and Plan :    Encounter Diagnoses  Name Primary?  . Encounter for health maintenance examination in adult Yes  . Essential hypertension   . HLD (hyperlipidemia)   . Morbid obesity, unspecified obesity type ([place])   . History of CHF (congestive heart failure)   . History of stroke   . Special screening for malignant neoplasms, colon   . Screening for prostate cancer   . Gastroesophageal reflux disease without esophagitis   . Chronic low back pain   . Paresthesia   . Vaccine refused by patient   . History of substance abuse   . Smoker   . Chest discomfort   . Depression     Physical exam - discussed healthy lifestyle, diet, exercise, preventative care, vaccinations, and addressed their concerns.   See your eye doctor yearly for routine vision care. See your dentist yearly for routine dental care including hygiene visits twice yearly. F/u soon for fasting labs Referral for first colonoscopy discussed PSA screening risks/benefits.   HTN - controlled without medication currently.   Hx/o stroke.  hyperlipidemia - compliant with medication for the most part. The 2 days per week he drinks alcohol he doesn't take any of his medications! Obesity - discussed need to lose weight Hx/o CHF, hx/o Stroke - need to work on modifiable risk factors! GERD - c/t same medication, referral for baseline EGD along with colonoscopy Chronic low back pain, paresthesias, hx/o  lumbar discectomy - may need to return for further eval Paresthesias - likely due to hx/o substance abuse vs lumbar radiculopathy He is up to date tetanus per his report.  He declines flu and pneumococcal vaccines. smoker- advised cessation  Chest discomfort - EKG today.  Consider PFTs. Depression - managed by psychiatry Follow-up pending labs

## 2015-06-10 ENCOUNTER — Other Ambulatory Visit: Payer: Managed Care, Other (non HMO)

## 2015-06-10 DIAGNOSIS — I10 Essential (primary) hypertension: Secondary | ICD-10-CM

## 2015-06-10 DIAGNOSIS — R202 Paresthesia of skin: Secondary | ICD-10-CM

## 2015-06-10 DIAGNOSIS — Z125 Encounter for screening for malignant neoplasm of prostate: Secondary | ICD-10-CM

## 2015-06-10 DIAGNOSIS — Z0000 Encounter for general adult medical examination without abnormal findings: Secondary | ICD-10-CM

## 2015-06-10 LAB — LIPID PANEL
CHOLESTEROL: 176 mg/dL (ref 125–200)
HDL: 23 mg/dL — ABNORMAL LOW (ref >=40)
LDL Cholesterol: 94 mg/dL (ref <130)
TRIGLYCERIDES: 297 mg/dL — AB (ref <150)
Total CHOL/HDL Ratio: 7.7 Ratio — ABNORMAL HIGH (ref <=5.0)
VLDL: 59 mg/dL — AB (ref <30)

## 2015-06-10 LAB — COMPREHENSIVE METABOLIC PANEL
ALBUMIN: 3.8 g/dL (ref 3.6–5.1)
ALK PHOS: 38 U/L — AB (ref 40–115)
ALT: 27 U/L (ref 9–46)
AST: 14 U/L (ref 10–35)
BILIRUBIN TOTAL: 0.6 mg/dL (ref 0.2–1.2)
BUN: 15 mg/dL (ref 7–25)
CO2: 31 mmol/L (ref 20–31)
CREATININE: 1.03 mg/dL (ref 0.70–1.33)
Calcium: 9.1 mg/dL (ref 8.6–10.3)
Chloride: 105 mmol/L (ref 98–110)
Glucose, Bld: 77 mg/dL (ref 65–99)
Potassium: 4.6 mmol/L (ref 3.5–5.3)
SODIUM: 142 mmol/L (ref 135–146)
Total Protein: 6.1 g/dL (ref 6.1–8.1)

## 2015-06-10 LAB — CBC
HEMATOCRIT: 44.7 % (ref 39.0–52.0)
HEMOGLOBIN: 14.3 g/dL (ref 13.0–17.0)
MCH: 29.1 pg (ref 26.0–34.0)
MCHC: 32 g/dL (ref 30.0–36.0)
MCV: 91 fL (ref 78.0–100.0)
MPV: 10 fL (ref 8.6–12.4)
PLATELETS: 226 10*3/uL (ref 150–400)
RBC: 4.91 MIL/uL (ref 4.22–5.81)
RDW: 14.4 % (ref 11.5–15.5)
WBC: 8.2 10*3/uL (ref 4.0–10.5)

## 2015-06-11 ENCOUNTER — Other Ambulatory Visit: Payer: Self-pay | Admitting: Medical

## 2015-06-11 LAB — MICROALBUMIN / CREATININE URINE RATIO
CREATININE, URINE: 207 mg/dL (ref 20–370)
MICROALB UR: 1.5 mg/dL
MICROALB/CREAT RATIO: 7 ug/mg{creat} (ref <30)

## 2015-06-11 LAB — TSH: TSH: 3.39 u[IU]/mL (ref 0.350–4.500)

## 2015-06-11 LAB — HEMOGLOBIN A1C
HEMOGLOBIN A1C: 5.7 % — AB (ref <5.7)
MEAN PLASMA GLUCOSE: 117 mg/dL — AB (ref <117)

## 2015-06-11 LAB — PSA: PSA: 0.46 ng/mL (ref <=4.00)

## 2015-06-11 LAB — VITAMIN B12: VITAMIN B 12: 448 pg/mL (ref 211–911)

## 2015-06-11 MED ORDER — ATORVASTATIN CALCIUM 40 MG PO TABS: 40.0000 mg | ORAL_TABLET | Freq: Every day | ORAL | Status: DC

## 2015-06-11 MED ORDER — FENOFIBRATE 145 MG PO TABS: 145.0000 mg | ORAL_TABLET | Freq: Every day | ORAL | Status: DC

## 2015-06-11 MED ORDER — PANTOPRAZOLE SODIUM 40 MG PO TBEC: 40.0000 mg | DELAYED_RELEASE_TABLET | Freq: Every day | ORAL | Status: DC

## 2015-06-11 MED ORDER — ASPIRIN EC 81 MG PO TBEC: 81.0000 mg | DELAYED_RELEASE_TABLET | Freq: Every day | ORAL | Status: DC

## 2015-06-18 ENCOUNTER — Encounter (HOSPITAL_COMMUNITY): Payer: Self-pay

## 2015-06-18 ENCOUNTER — Other Ambulatory Visit: Payer: Self-pay

## 2015-06-18 ENCOUNTER — Emergency Department (HOSPITAL_COMMUNITY)
Admission: EM | Admit: 2015-06-18 | Discharge: 2015-06-18 | Disposition: A | Discharge status: Home / Self Care | Payer: Managed Care, Other (non HMO) | Attending: Emergency Medicine | Admitting: Emergency Medicine

## 2015-06-18 ENCOUNTER — Emergency Department (HOSPITAL_COMMUNITY): Payer: Managed Care, Other (non HMO)

## 2015-06-18 DIAGNOSIS — Z79899 Other long term (current) drug therapy: Secondary | ICD-10-CM | POA: Insufficient documentation

## 2015-06-18 DIAGNOSIS — I509 Heart failure, unspecified: Secondary | ICD-10-CM | POA: Diagnosis not present

## 2015-06-18 DIAGNOSIS — R0789 Other chest pain: Secondary | ICD-10-CM | POA: Diagnosis not present

## 2015-06-18 DIAGNOSIS — K219 Gastro-esophageal reflux disease without esophagitis: Secondary | ICD-10-CM | POA: Insufficient documentation

## 2015-06-18 DIAGNOSIS — F17210 Nicotine dependence, cigarettes, uncomplicated: Secondary | ICD-10-CM | POA: Insufficient documentation

## 2015-06-18 DIAGNOSIS — R079 Chest pain, unspecified: Secondary | ICD-10-CM

## 2015-06-18 DIAGNOSIS — F319 Bipolar disorder, unspecified: Secondary | ICD-10-CM | POA: Diagnosis not present

## 2015-06-18 DIAGNOSIS — I10 Essential (primary) hypertension: Secondary | ICD-10-CM | POA: Insufficient documentation

## 2015-06-18 DIAGNOSIS — Z7982 Long term (current) use of aspirin: Secondary | ICD-10-CM | POA: Diagnosis not present

## 2015-06-18 DIAGNOSIS — Z8673 Personal history of transient ischemic attack (TIA), and cerebral infarction without residual deficits: Secondary | ICD-10-CM | POA: Diagnosis not present

## 2015-06-18 DIAGNOSIS — F419 Anxiety disorder, unspecified: Secondary | ICD-10-CM | POA: Diagnosis not present

## 2015-06-18 DIAGNOSIS — Z9889 Other specified postprocedural states: Secondary | ICD-10-CM | POA: Insufficient documentation

## 2015-06-18 LAB — BASIC METABOLIC PANEL
ANION GAP: 10 (ref 5–15)
BUN: 10 mg/dL (ref 6–20)
CALCIUM: 9.4 mg/dL (ref 8.9–10.3)
CO2: 24 mmol/L (ref 22–32)
CREATININE: 1.13 mg/dL (ref 0.61–1.24)
Chloride: 107 mmol/L (ref 101–111)
GFR calc Af Amer: >60 mL/min (ref >60)
GLUCOSE: 105 mg/dL — AB (ref 65–99)
Potassium: 3.9 mmol/L (ref 3.5–5.1)
Sodium: 141 mmol/L (ref 135–145)

## 2015-06-18 LAB — CBC
HCT: 44.9 % (ref 39.0–52.0)
Hemoglobin: 14.5 g/dL (ref 13.0–17.0)
MCH: 29.5 pg (ref 26.0–34.0)
MCHC: 32.3 g/dL (ref 30.0–36.0)
MCV: 91.4 fL (ref 78.0–100.0)
PLATELETS: 262 10*3/uL (ref 150–400)
RBC: 4.91 MIL/uL (ref 4.22–5.81)
RDW: 14.7 % (ref 11.5–15.5)
WBC: 9 10*3/uL (ref 4.0–10.5)

## 2015-06-18 LAB — I-STAT TROPONIN, ED
Troponin i, poc: 0 ng/mL (ref 0.00–0.08)
Troponin i, poc: 0 ng/mL (ref 0.00–0.08)

## 2015-06-18 LAB — D-DIMER, QUANTITATIVE (NOT AT ARMC): D DIMER QUANT: 0.44 ug{FEU}/mL (ref 0.00–0.50)

## 2015-06-18 MED ORDER — MORPHINE SULFATE (PF) 4 MG/ML IV SOLN
4.0000 mg | Freq: Once | INTRAVENOUS | Status: AC
  Administered 2015-06-18: 4 mg via INTRAVENOUS
  Filled 2015-06-18: qty 1

## 2015-06-18 NOTE — Discharge Instructions (Signed)
Please read and follow all provided instructions.  Your diagnoses today include:  1. Chest pain, unspecified chest pain type    Tests performed today include:  An EKG of your heart  A chest x-ray  Cardiac enzymes - a blood test for heart muscle damage  Blood counts and electrolytes  Blood test for blood clot - negative  Vital signs. See below for your results today.   Medications prescribed:   None  Take any prescribed medications only as directed.  Follow-up instructions: Please follow-up with your primary care provider as soon as you can for further evaluation of your symptoms.   Return instructions:  SEEK IMMEDIATE MEDICAL ATTENTION IF:  You have severe chest pain, especially if the pain is crushing or pressure-like and spreads to the arms, back, neck, or jaw, or if you have sweating, nausea (feeling sick to your stomach), or shortness of breath. THIS IS AN EMERGENCY. Don't wait to see if the pain will go away. Get medical help at once. Call 911 or 0 (operator). DO NOT drive yourself to the hospital.   Your chest pain gets worse and does not go away with rest.   You have an attack of chest pain lasting longer than usual, despite rest and treatment with the medications your caregiver has prescribed.   You wake from sleep with chest pain or shortness of breath.  You feel dizzy or faint.  You have chest pain not typical of your usual pain for which you originally saw your caregiver.   You have any other emergent concerns regarding your health.  Additional Information: Chest pain comes from many different causes. Your caregiver has diagnosed you as having chest pain that is not specific for one problem, but does not require admission.  You are at low risk for an acute heart condition or other serious illness.   Your vital signs today were: BP 116/62 mmHg   Pulse 79   Temp(Src) 98 F (36.7 C) (Oral)   Resp 14   SpO2 94% If your blood pressure (BP) was elevated above  135/85 this visit, please have this repeated by your doctor within one month. --------------

## 2015-06-18 NOTE — ED Notes (Signed)
EKG was performed by Nira Conn, EMT

## 2015-06-18 NOTE — ED Notes (Signed)
Patient physically moved from hallway to room 10; patient undressed, in gown, on monitor, continuous pulse oximetry and blood pressure cuff

## 2015-06-18 NOTE — Consult Note (Signed)
CARDIOLOGY CONSULT NOTE   Patient ID: [name] MRN: [id number] DOB/AGE: 1965-10-31 49 y.o.  Admit date: 06/18/2015  Primary Physician   [name], PA-C Primary Cardiologist   Dr. [name] Reason for Consultation   CP Referring physician [name], PA-C  HPI: [name] is a 49 y.o. male with a history of severe anxiety, depression, bipolar,  CVA, hypertension, alcohol abuse, HL and self reported congestive heart disease who came to ED today for evaluation of chest pain.  Patient was admitted 10/28/17-11/01/14 for cryptogenic stroke. Echo showed no cardiac source of embolism with LV Ef of 60% and mild LVH. No WM abnormality. S/p  Medtronic LINQ Reveal Loop Recorder Serial Number [id number] was inserted. He was involved in remote device check. Last check was normal at 04/30/15. Myoview 11/15/14 shows no evidence of ischemia however the study was intermediate risk due to low EF (48%)  The patient currently undergoing extreme stress. He was in an argument with his son last night regarding patient's binge alcohol drinking. This morning he was on the phone with his friend, talking about yesterday's incident while he got upset and angry. And began to have a severe substernal chest pressure and shortness of breath along with diaphoresis and numbness of his left arm. He denies radiation of pain though. No nausea or vomiting. He called EMS and received 324 mg of aspirin. His pain lasted for approximately 2-3 hours and it was subsided after getting a morphine in ED. His stay status with exertion she gets chest pain and shortness of breath, however, also reports to having very anxious for unknown reason at that time as well. The patient denies lower extremity edema, palpitations, orthopnea, PND, melena or blood in his stool.  In ED, point-of-care troponin x 2 negative. D-dimer negative. Chest x-ray clear. EKG shows normal sinus rhythm with out acute abnormality.  Recently workup  06/10/15 at PCP showed hemoglobin A1c of 5.7. Normal TSH and PSA. 06/10/2015: Cholesterol 176; HDL 23*; LDL Cholesterol 94; Triglycerides 297*; VLDL 59*   Currently he is feeling well without chest pain or shortness of breath and calm down. He does not want to go any rehabilitation program.   Past Medical History  Diagnosis Date  . Depression   . Congenital heart disease   . Back pain   . History of ETOH abuse   . CHF (congestive heart failure) ([place])   . Essential hypertension   . CVA (cerebral vascular accident) ([place])     embolic stroke [id number]  . Congenital heart disease     ? self reported valve issue  . Bipolar depression ([place])   . Substance abuse   . Anxiety   . GERD (gastroesophageal reflux disease)   . Headache      Past Surgical History  Procedure Laterality Date  . Cardiac catheterization      at age 7  . Back surgery Bilateral 1996  . Skin graft    . Carpal tunnel release    . Loop recorder implant N/A 11/01/2014    Procedure: LOOP RECORDER IMPLANT;  Surgeon: Deboraha Sprang, MD;  Location[facility] CATH LAB;  Service: Cardiovascular;  Laterality: N/A;  . Tee without cardioversion N/A 11/01/2014    Procedure: TRANSESOPHAGEAL ECHOCARDIOGRAM (TEE);  Surgeon: Josue Hector, MD;  Location[facility] ENDOSCOPY;  Service: Cardiovascular;  Laterality: N/A;  . Umbilical hernia repair N/A 05/08/2015    Procedure: UMBILICAL HERNIA REPAIR WITH MESH;  Surgeon: Erroll Luna, MD;  Location: [facility];  Service: General;  Laterality: N/A;  . Insertion of mesh N/A 05/08/2015    Procedure: INSERTION OF MESH;  Surgeon: Erroll Luna, MD;  Location: [place];  Service: General;  Laterality: N/A;    Allergies  Allergen Reactions  . Codeine Nausea And Vomiting    I have reviewed the patient's current medications Prior to Admission medications   Medication Sig Start Date End Date Taking? Authorizing Provider  Asenapine Maleate (SAPHRIS SL) Place 1 tablet under  the tongue as needed.    Yes Historical Provider, MD  atorvastatin (LIPITOR) 40 MG tablet Take 1 tablet (40 mg total) by mouth daily at 6 PM. 06/11/15  Yes Camelia Eng Tysinger, PA-C  benztropine (COGENTIN) 1 MG tablet Take 1 mg by mouth 2 (two) times daily.   Yes Historical Provider, MD  diazepam (VALIUM) 5 MG tablet Take 5 mg by mouth every 12 (twelve) hours as needed for anxiety.   Yes Historical Provider, MD  divalproex (DEPAKOTE ER) 500 MG 24 hr tablet Take 1,000 mg by mouth 2 (two) times daily.    Yes Historical Provider, MD  fenofibrate (TRICOR) 145 MG tablet Take 1 tablet (145 mg total) by mouth daily. 06/11/15  Yes Camelia Eng Tysinger, PA-C  pantoprazole (PROTONIX) 40 MG tablet Take 1 tablet (40 mg total) by mouth daily. 06/11/15  Yes Camelia Eng Tysinger, PA-C  aspirin EC 81 MG tablet Take 1 tablet (81 mg total) by mouth daily. 06/11/15   Carlena Hurl, PA-C     Social History   Social History  . Marital Status: Married    Spouse Name: N/A  . Number of Children: 2  . Years of Education: 8   Occupational History  . Unemployed    Social History Main Topics  . Smoking status: Current Some Day Smoker -- 0.50 packs/day    Types: Cigarettes  . Smokeless tobacco: Never Used     Comment: Says he smokes more when he is drinking.  . Alcohol Use: 0.0 oz/week    0 Standard drinks or equivalent per week     Comment: daily beer drinker  . Drug Use: Yes    Special: Marijuana     Comment: last smoked about 1-2 mos ago  . Sexual Activity: Not on file   Other Topics Concern  . Not on file   Social History Narrative   Lives at home with wife.   Right-handed.   2 cups caffeine daily.    Family Status  Relation Status Death Age  . Mother Alive   . Father Alive    Family History  Problem Relation Age of Onset  . Cancer Maternal Grandmother   . Cancer Paternal Grandfather     lymphoma  . Arthritis Mother   . Hearing loss Mother   . Heart block Father      ROS:  Full 14 point review of  systems complete and found to be negative unless listed above.  Physical Exam: Blood pressure 116/62, pulse 79, temperature 98 F (36.7 C), temperature source Oral, resp. rate 14, SpO2 94 %.  General: Well developed, well nourished, male in no acute distress Head: Eyes PERRLA, No xanthomas. Normocephalic and atraumatic, oropharynx without edema or exudate.  Lungs: Resp regular and unlabored, CTA. Heart: RRR no s3, s4, or murmurs. Neck: No carotid bruits. No lymphadenopathy.  No JVD. Abdomen: Bowel sounds present, abdomen soft and non-tender without masses or hernias noted. Msk:  No spine or cva tenderness. No weakness,  no joint deformities or effusions. Extremities: No clubbing, cyanosis or edema. DP/PT/Radials 2+ and equal bilaterally. Neuro: Alert and oriented X 3. No focal deficits noted. Psych:  Good affect, responds appropriately Skin: No rashes or lesions noted.  Labs:   Lab Results  Component Value Date   WBC 9.0 06/18/2015   HGB 14.5 06/18/2015   HCT 44.9 06/18/2015   MCV 91.4 06/18/2015   PLT 262 06/18/2015   No results for input(s): INR in the last 72 hours.  Recent Labs Lab 06/18/15 1107  NA 141  K 3.9  CL 107  CO2 24  BUN 10  CREATININE 1.13  CALCIUM 9.4  GLUCOSE 105*    Recent Labs  06/18/15 1112  TROPIPOC 0.00   No results found for: PROBNP Lab Results  Component Value Date   CHOL 176 06/10/2015   HDL 23* 06/10/2015   LDLCALC 94 06/10/2015   TRIG 297* 06/10/2015   Lab Results  Component Value Date   DDIMER 0.44 06/18/2015   No results found for: LIPASE, AMYLASE TSH  Date/Time Value Ref Range Status  06/10/2015 12:01 AM 3.390 0.350 - 4.500 uIU/mL Final   VITAMIN B-12  Date/Time Value Ref Range Status  06/10/2015 08:24 AM 448 211 - 911 pg/mL Final    Echo: 10/30/2014 LV EF: 60%  ------------------------------------------------------------------- Indications:   CVA  436.  ------------------------------------------------------------------- History:  PMH: ETOH abuse. Congenital heart disease. umbilical hernia. Chest pain. Risk factors: Current tobacco use.  ------------------------------------------------------------------- Study Conclusions  - Left ventricle: The cavity size was normal. Wall thickness was increased in a pattern of mild LVH. The estimated ejection fraction was 60%. Wall motion was normal; there were no regional wall motion abnormalities. - Left atrium: The atrium was mildly dilated. - Right ventricle: The cavity size was normal. Systolic function was normal. - Impressions: No cardiac source of embolism was identified, but cannot be ruled out on the basis of this examination.  Impressions:  - No cardiac source of embolism was identified, but cannot be ruled out on the basis of this examination.   Gated Spect Myocardial Perfusion Imaging with Regadenoson Stress 1 Day Rest 11/15/2014 - There was a fixed, small mild apical perfusion defect and a fixed small, mild basal inferior perfusion defect. EF was globally decreased at 48%. No evidence for ischemia. The defects may be attenuation, but cannot rule out possible prior infarction.   Intermediate risk primarily due to low EF (no ischemia).   Of note, EF was normal on recent echo.   ECG: Vent. rate 98 BPM PR interval 134 ms QRS duration 80 ms QT/QTc 348/444 ms P-R-T axes 74 20 63  Radiology:  Dg Chest 2 View  06/18/2015  CLINICAL DATA:  Shortness of breath and central chest pain for 2 hours. Initial encounter. EXAM: CHEST  2 VIEW COMPARISON:  PA and lateral chest 10/29/2014. FINDINGS: The lungs are clear. Heart size is normal. No pneumothorax or pleural effusion. The left costophrenic angle is just off the lateral margin of the film on the frontal view. Loop recorder is noted. No acute disease. IMPRESSION: No acute disease. Electronically Signed   By: Inge Rise M.D.   On: 06/18/2015 11:10    ASSESSMENT AND PLAN:     1. Chest pain - His chest pain is very atypical in nature. Reresent anxiety/psychiatric etiology. Troponin x  2 negative. EKG without ischemic changes. D-dimer negative. - Based on his story seems like the patient gets some type of chest pain and shortness  of breath whenever he gets anxious, upset or angry.  - Differential could include [place] cardiomyopathy however negative troponin without ischemic EKG. - Echo 10/2014 showed no cardiac source of embolism with LV Ef of 60% and mild LVH. - Myoview 11/15/14 shows no evidence of ischemia however the study was intermediate risk due to low EF (48%)  2. Hx of CVA - Patient was admitted 10/28/17-11/01/14 for cryptogenic stroke. S/p  Medtronic LINQ Reveal Loop Recorder Serial Number [id number] was inserted. Last check was normal at 04/30/15.   3. Alcohol abuse - He is a binge alcohol drinker. Does not want to walk about any rehabilitation program. - Advice to stop alcohol drinking. States he is not taking his psychiatric medication while he drinks.   4. Psychiatric issue - History of anxiety, depression and bipolar - He takes benztropine 1 mg twice a day, Valium 5 mg twice a day as needed and Depakote ER 1000 milligrams twice a day. - Encouraged to follow-up with psychiatrist.   5. HL - 06/10/2015: Cholesterol 176; HDL 23*; LDL Cholesterol 94; Triglycerides 297*; VLDL 59*  - Continue Finofibrate  Dispo: Ok to discharge on cardiac stand point. F/u with PCP/psychiatrist.   SignedLeanor Kail, PA 06/18/2015, 3:00 PM Pager [phone]  Co-Sign MD  Attending Note:   The patient was seen and examined on Dec. 14., 2016 .  Agree with assessment and plan as noted above.  Changes made to the above note as needed.  Pt is doing well.   Pain does not sound cardiac .  But more like Anxiety  He should return if he has recurrent cp   Thayer Headings, Brooke Bonito., MD,  [facility] 06/19/2015, 10:07 AM [address]  [place] Pager [phone]

## 2015-06-18 NOTE — ED Notes (Signed)
Pt. At desk requesting to be discharged.
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
## 2015-06-18 NOTE — ED Provider Notes (Signed)
5:17 PM Handoff from [facility] pending cardiology consult. Trop neg x 2. Cards has see and cleared to go per note. Patient is up at the desk requesting discharge. Discussed with Dr. Ralene Bathe. Will d/c.   BP 116/62 mmHg  Pulse 79  Temp(Src) 98 F (36.7 C) (Oral)  Resp 14  SpO2 94%   Carlisle Cater, PA-C 06/18/15 [place], MD 06/19/15 [phone]

## 2015-06-24 ENCOUNTER — Telehealth: Payer: Self-pay

## 2015-06-24 NOTE — Telephone Encounter (Signed)
Called pt about colonoscopy referral, he had a recent visit to the ED from a severe panic attack.

## 2015-07-01 ENCOUNTER — Ambulatory Visit (INDEPENDENT_AMBULATORY_CARE_PROVIDER_SITE_OTHER): Payer: Managed Care, Other (non HMO) | Admitting: *Deleted

## 2015-07-01 DIAGNOSIS — I639 Cerebral infarction, unspecified: Secondary | ICD-10-CM | POA: Diagnosis not present

## 2015-07-01 NOTE — Progress Notes (Signed)
Carelink Summary Report / Loop Recorder 

## 2015-07-17 ENCOUNTER — Telehealth: Payer: Self-pay

## 2015-07-17 NOTE — Telephone Encounter (Signed)
Medical Records sent to [facility] on 07/17/15  [id number]

## 2015-07-25 ENCOUNTER — Encounter: Payer: Self-pay | Admitting: Medical

## 2015-07-28 ENCOUNTER — Telehealth: Payer: Self-pay

## 2015-07-28 NOTE — Telephone Encounter (Signed)
From [facility]:  07/25/2015 MRN; [id number]   [name]   Dear Dr. [name]:  Thank you for your kind referral of the above patient.  We have attempted to schedule the recommended procedure colonoscopy but have not been able to schedule because:  _X__ The patient was not available by phone and/or has not returned our calls.  ___ The patient declined to schedule the procedure at this time.  We appreciate the referral and hope that we will have the opportunity to treat this patient in the future.    Sincerely,  [facility] [phone]

## 2015-07-28 NOTE — Telephone Encounter (Signed)
Spoke with pts wife she tried to schedule appt and they said the pt had to so she hasnt had a chance to call back. Took the number from me and stated they would call as soon as possible.

## 2015-07-29 ENCOUNTER — Ambulatory Visit (INDEPENDENT_AMBULATORY_CARE_PROVIDER_SITE_OTHER): Payer: Managed Care, Other (non HMO) | Admitting: *Deleted

## 2015-07-29 DIAGNOSIS — I639 Cerebral infarction, unspecified: Secondary | ICD-10-CM

## 2015-07-30 NOTE — Progress Notes (Signed)
Carelink Summary Report / Loop Recorder 

## 2015-08-09 LAB — CUP PACEART REMOTE DEVICE CHECK: Date Time Interrogation Session: 20161225213636

## 2015-08-28 ENCOUNTER — Ambulatory Visit (INDEPENDENT_AMBULATORY_CARE_PROVIDER_SITE_OTHER): Payer: Managed Care, Other (non HMO) | Admitting: *Deleted

## 2015-08-28 DIAGNOSIS — I639 Cerebral infarction, unspecified: Secondary | ICD-10-CM | POA: Diagnosis not present

## 2015-08-29 NOTE — Progress Notes (Signed)
Carelink Summary Report / Loop Recorder 

## 2015-09-08 LAB — CUP PACEART REMOTE DEVICE CHECK: Date Time Interrogation Session: 20170124220757

## 2015-09-08 NOTE — Progress Notes (Signed)
Carelink summary report received. Battery status OK. Normal device function. No new symptom episodes, tachy episodes, brady, or pause episodes. No new AF episodes. Monthly summary reports and ROV/PRN 

## 2015-09-15 LAB — CUP PACEART REMOTE DEVICE CHECK: Date Time Interrogation Session: 20170223220658

## 2015-09-15 NOTE — Progress Notes (Signed)
Carelink summary report received. Battery status OK. Normal device function. No new symptom episodes, tachy episodes, brady, or pause episodes. No new AF episodes. Monthly summary reports and ROV/PRN 

## 2015-09-29 ENCOUNTER — Ambulatory Visit (INDEPENDENT_AMBULATORY_CARE_PROVIDER_SITE_OTHER): Payer: Managed Care, Other (non HMO) | Admitting: *Deleted

## 2015-09-29 DIAGNOSIS — I639 Cerebral infarction, unspecified: Secondary | ICD-10-CM

## 2015-09-29 NOTE — Progress Notes (Signed)
Carelink Summary Report / Loop Recorder 

## 2015-10-27 ENCOUNTER — Ambulatory Visit (INDEPENDENT_AMBULATORY_CARE_PROVIDER_SITE_OTHER): Payer: Managed Care, Other (non HMO) | Admitting: *Deleted

## 2015-10-27 DIAGNOSIS — I639 Cerebral infarction, unspecified: Secondary | ICD-10-CM | POA: Diagnosis not present

## 2015-10-28 NOTE — Progress Notes (Signed)
Carelink Summary Report / Loop Recorder 

## 2015-11-26 ENCOUNTER — Ambulatory Visit (INDEPENDENT_AMBULATORY_CARE_PROVIDER_SITE_OTHER): Payer: Managed Care, Other (non HMO) | Admitting: *Deleted

## 2015-11-26 DIAGNOSIS — I639 Cerebral infarction, unspecified: Secondary | ICD-10-CM

## 2015-11-26 LAB — CUP PACEART REMOTE DEVICE CHECK: Date Time Interrogation Session: 20170524230619

## 2015-11-27 NOTE — Progress Notes (Signed)
Carelink Summary Report / Loop Recorder 

## 2015-11-29 LAB — CUP PACEART REMOTE DEVICE CHECK: MDC IDC SESS DTM: 20170325223809

## 2015-11-29 NOTE — Progress Notes (Signed)
Carelink summary report received. Battery status OK. Normal device function. No new symptom episodes, tachy episodes, brady, or pause episodes. No new AF episodes. Monthly summary reports and ROV/PRN 

## 2015-12-01 LAB — CUP PACEART REMOTE DEVICE CHECK: MDC IDC SESS DTM: 20170424230704

## 2015-12-01 NOTE — Progress Notes (Signed)
Carelink summary report received. Battery status OK. Normal device function. No new symptom episodes, tachy episodes, brady, or pause episodes. No new AF episodes. Monthly summary reports and ROV/PRN 

## 2015-12-26 ENCOUNTER — Ambulatory Visit (INDEPENDENT_AMBULATORY_CARE_PROVIDER_SITE_OTHER): Payer: Managed Care, Other (non HMO) | Admitting: *Deleted

## 2015-12-26 DIAGNOSIS — I639 Cerebral infarction, unspecified: Secondary | ICD-10-CM | POA: Diagnosis not present

## 2015-12-29 NOTE — Progress Notes (Signed)
Carelink Summary Report / Loop Recorder 

## 2016-01-13 ENCOUNTER — Emergency Department (HOSPITAL_COMMUNITY)
Admission: EM | Admit: 2016-01-13 | Discharge: 2016-01-14 | Disposition: A | Discharge status: Home / Self Care | Payer: Managed Care, Other (non HMO) | Attending: Emergency Medicine | Admitting: Emergency Medicine

## 2016-01-13 ENCOUNTER — Encounter (HOSPITAL_COMMUNITY): Payer: Self-pay | Admitting: Emergency Medicine

## 2016-01-13 ENCOUNTER — Emergency Department (HOSPITAL_COMMUNITY): Payer: Managed Care, Other (non HMO)

## 2016-01-13 DIAGNOSIS — Z79899 Other long term (current) drug therapy: Secondary | ICD-10-CM | POA: Insufficient documentation

## 2016-01-13 DIAGNOSIS — Z7982 Long term (current) use of aspirin: Secondary | ICD-10-CM | POA: Diagnosis not present

## 2016-01-13 DIAGNOSIS — I110 Hypertensive heart disease with heart failure: Secondary | ICD-10-CM | POA: Diagnosis not present

## 2016-01-13 DIAGNOSIS — Z8673 Personal history of transient ischemic attack (TIA), and cerebral infarction without residual deficits: Secondary | ICD-10-CM | POA: Diagnosis not present

## 2016-01-13 DIAGNOSIS — R55 Syncope and collapse: Secondary | ICD-10-CM | POA: Insufficient documentation

## 2016-01-13 DIAGNOSIS — F17210 Nicotine dependence, cigarettes, uncomplicated: Secondary | ICD-10-CM | POA: Insufficient documentation

## 2016-01-13 DIAGNOSIS — I509 Heart failure, unspecified: Secondary | ICD-10-CM | POA: Diagnosis not present

## 2016-01-13 LAB — BASIC METABOLIC PANEL
Anion gap: 12 (ref 5–15)
BUN: 7 mg/dL (ref 6–20)
CHLORIDE: 99 mmol/L — AB (ref 101–111)
CO2: 26 mmol/L (ref 22–32)
Calcium: 9.3 mg/dL (ref 8.9–10.3)
Creatinine, Ser: 1.09 mg/dL (ref 0.61–1.24)
GFR calc non Af Amer: >60 mL/min (ref >60)
Glucose, Bld: 89 mg/dL (ref 65–99)
POTASSIUM: 4 mmol/L (ref 3.5–5.1)
SODIUM: 137 mmol/L (ref 135–145)

## 2016-01-13 LAB — I-STAT TROPONIN, ED: Troponin i, poc: 0 ng/mL (ref 0.00–0.08)

## 2016-01-13 LAB — HEPATIC FUNCTION PANEL
ALBUMIN: 3.5 g/dL (ref 3.5–5.0)
ALT: 37 U/L (ref 17–63)
AST: 22 U/L (ref 15–41)
Alkaline Phosphatase: 40 U/L (ref 38–126)
BILIRUBIN INDIRECT: 0.2 mg/dL — AB (ref 0.3–0.9)
Bilirubin, Direct: 0.1 mg/dL (ref 0.1–0.5)
TOTAL PROTEIN: 6.4 g/dL — AB (ref 6.5–8.1)
Total Bilirubin: 0.3 mg/dL (ref 0.3–1.2)

## 2016-01-13 LAB — URINALYSIS, ROUTINE W REFLEX MICROSCOPIC
Bilirubin Urine: NEGATIVE
Glucose, UA: NEGATIVE mg/dL
Hgb urine dipstick: NEGATIVE
Ketones, ur: NEGATIVE mg/dL
LEUKOCYTES UA: NEGATIVE
Nitrite: NEGATIVE
PROTEIN: NEGATIVE mg/dL
SPECIFIC GRAVITY, URINE: 1.006 (ref 1.005–1.030)
pH: 6.5 (ref 5.0–8.0)

## 2016-01-13 LAB — CBC
HCT: 43.2 % (ref 39.0–52.0)
HEMOGLOBIN: 14.1 g/dL (ref 13.0–17.0)
MCH: 29.4 pg (ref 26.0–34.0)
MCHC: 32.6 g/dL (ref 30.0–36.0)
MCV: 90 fL (ref 78.0–100.0)
PLATELETS: 285 10*3/uL (ref 150–400)
RBC: 4.8 MIL/uL (ref 4.22–5.81)
RDW: 13.7 % (ref 11.5–15.5)
WBC: 10.3 10*3/uL (ref 4.0–10.5)

## 2016-01-13 LAB — CBG MONITORING, ED: GLUCOSE-CAPILLARY: 88 mg/dL (ref 65–99)

## 2016-01-13 LAB — ETHANOL: ALCOHOL ETHYL (B): 44 mg/dL — AB (ref <5)

## 2016-01-13 MED ORDER — SODIUM CHLORIDE 0.9 % IV BOLUS (SEPSIS)
1000.0000 mL | Freq: Once | INTRAVENOUS | Status: AC
  Administered 2016-01-13: 1000 mL via INTRAVENOUS

## 2016-01-13 MED ORDER — THIAMINE HCL 100 MG/ML IJ SOLN
100.0000 mg | Freq: Once | INTRAMUSCULAR | Status: AC
  Administered 2016-01-13: 100 mg via INTRAVENOUS
  Filled 2016-01-13: qty 2

## 2016-01-13 NOTE — ED Provider Notes (Signed)
CSN: [id number]     Arrival date & time 01/13/16  1634 History   First MD Initiated Contact with Patient 01/13/16 1958     Chief Complaint  Patient presents with  . Loss of Consciousness     (Consider location/radiation/quality/duration/timing/severity/associated sxs/prior Treatment) Patient is a 50 y.o. male presenting with syncope. The history is provided by the patient and the spouse.  Loss of Consciousness Episode history:  Single Most recent episode:  Today Duration: unknown. Progression:  Resolved Chronicity:  New Context comment:  Walking inside after taking the trash out. Drank 8-9 beers today, which is fewer than usual.  Witnessed: no   Relieved by:  None tried Ineffective treatments:  None tried Associated symptoms: dizziness (room spinning), fever and visual change (blurry vision for 2 hours after the episode)   Associated symptoms: no anxiety, no confusion, no diaphoresis, no difficulty breathing, no focal sensory loss, no focal weakness, no headaches, no malaise/fatigue, no nausea, no palpitations, no recent fall, no recent injury, no seizures, no shortness of breath, no vomiting and no weakness     Past Medical History  Diagnosis Date  . Depression   . Congenital heart disease   . Back pain   . History of ETOH abuse   . CHF (congestive heart failure) ([place])   . Essential hypertension   . CVA (cerebral vascular accident) ([place])     embolic stroke [id number]  . Congenital heart disease     ? self reported valve issue  . Bipolar depression ([place])   . Substance abuse   . Anxiety   . GERD (gastroesophageal reflux disease)   . Headache    Past Surgical History  Procedure Laterality Date  . Cardiac catheterization      at age 1  . Back surgery Bilateral 1996  . Skin graft    . Carpal tunnel release    . Loop recorder implant N/A 11/01/2014    Procedure: LOOP RECORDER IMPLANT;  Surgeon: Deboraha Sprang, MD;  Location: [facility] CATH LAB;  Service: Cardiovascular;  Laterality:  N/A;  . Tee without cardioversion N/A 11/01/2014    Procedure: TRANSESOPHAGEAL ECHOCARDIOGRAM (TEE);  Surgeon: Josue Hector, MD;  Location: [facility] ENDOSCOPY;  Service: Cardiovascular;  Laterality: N/A;  . Umbilical hernia repair N/A 05/08/2015    Procedure: UMBILICAL HERNIA REPAIR WITH MESH;  Surgeon: Erroll Luna, MD;  Location: [place];  Service: General;  Laterality: N/A;  . Insertion of mesh N/A 05/08/2015    Procedure: INSERTION OF MESH;  Surgeon: Erroll Luna, MD;  Location: [place];  Service: General;  Laterality: N/A;   Family History  Problem Relation Age of Onset  . Cancer Maternal Grandmother   . Cancer Paternal Grandfather     lymphoma  . Arthritis Mother   . Hearing loss Mother   . Heart block Father    Social History  Substance Use Topics  . Smoking status: Current Some Day Smoker -- 0.50 packs/day    Types: Cigarettes  . Smokeless tobacco: Never Used     Comment: Says he smokes more when he is drinking.  . Alcohol Use: 0.0 oz/week    0 Standard drinks or equivalent per week     Comment: daily beer drinker    Review of Systems  Constitutional: Positive for fever. Negative for malaise/fatigue and diaphoresis.  HENT: Negative for congestion.   Eyes: Positive for visual disturbance.  Respiratory: Negative for shortness of breath.   Cardiovascular: Positive for syncope. Negative  for palpitations.  Gastrointestinal: Negative for nausea and vomiting.  Genitourinary: Negative for dysuria and flank pain.  Musculoskeletal: Positive for gait problem.  Skin: Negative for rash.  Neurological: Positive for dizziness (room spinning). Negative for focal weakness, seizures, weakness and headaches.  Psychiatric/Behavioral: Negative for confusion.      Allergies  Codeine  Home Medications   Prior to Admission medications   Medication Sig Start Date End Date Taking? Authorizing Provider  atorvastatin (LIPITOR) 40 MG tablet Take 1 tablet  (40 mg total) by mouth daily at 6 PM. 06/11/15  Yes Camelia Eng Tysinger, PA-C  benztropine (COGENTIN) 1 MG tablet Take 1 mg by mouth 2 (two) times daily. Reported on 01/13/2016   Yes Historical Provider, MD  diazepam (VALIUM) 5 MG tablet Take 5 mg by mouth every 12 (twelve) hours as needed for anxiety.   Yes Historical Provider, MD  divalproex (DEPAKOTE) 500 MG DR tablet Take 1,000 mg by mouth 2 (two) times daily. 12/03/15  Yes Historical Provider, MD  fenofibrate (TRICOR) 145 MG tablet Take 1 tablet (145 mg total) by mouth daily. 06/11/15  Yes Camelia Eng Tysinger, PA-C  naltrexone (DEPADE) 50 MG tablet Take 50 mg by mouth daily. 12/31/15  Yes Historical Provider, MD  pantoprazole (PROTONIX) 40 MG tablet Take 1 tablet (40 mg total) by mouth daily. 06/11/15  Yes Camelia Eng Tysinger, PA-C  ranitidine (ZANTAC) 150 MG tablet Take 150 mg by mouth 2 (two) times daily as needed for heartburn.   Yes Historical Provider, MD  aspirin EC 81 MG tablet Take 1 tablet (81 mg total) by mouth daily. Patient not taking: Reported on 01/13/2016 06/11/15   Camelia Eng Tysinger, PA-C  divalproex (DEPAKOTE ER) 500 MG 24 hr tablet Take 1,000 mg by mouth 2 (two) times daily.     Historical Provider, MD   BP 119/96 mmHg  Pulse 86  Temp(Src) 98.8 F (37.1 C) (Oral)  Resp 28  Ht 5\' 6"  (1.676 m)  Wt 105.235 kg  BMI 37.46 kg/m2  SpO2 91% Physical Exam  Constitutional: He is oriented to person, place, and time. He appears well-developed and well-nourished. No distress.  HENT:  Head: Normocephalic and atraumatic.  Eyes: Conjunctivae are normal.  Cardiovascular: Normal rate and normal heart sounds.   No murmur heard. Pulmonary/Chest: Effort normal and breath sounds normal. He has no wheezes.  Abdominal: Soft. He exhibits distension. There is no tenderness. There is no rebound and no guarding.  Musculoskeletal: He exhibits no edema.  No c-spine tenderness  Neurological: He is alert and oriented to person, place, and time. No cranial nerve  deficit. Coordination normal.  Unstable gait  Skin: Skin is warm. He is not diaphoretic.  Psychiatric: He has a normal mood and affect. His behavior is normal.  Nursing note and vitals reviewed.   ED Course  Procedures (including critical care time) Labs Review Labs Reviewed  BASIC METABOLIC PANEL - Abnormal; Notable for the following:    Chloride 99 (*)    All other components within normal limits  HEPATIC FUNCTION PANEL - Abnormal; Notable for the following:    Total Protein 6.4 (*)    Indirect Bilirubin 0.2 (*)    All other components within normal limits  ETHANOL - Abnormal; Notable for the following:    Alcohol, Ethyl (B) 44 (*)    All other components within normal limits  CBC  URINALYSIS, ROUTINE W REFLEX MICROSCOPIC (NOT AT [facility])  URINE RAPID DRUG SCREEN, HOSP PERFORMED  CBG MONITORING, ED  Randolm Idol, ED    Imaging Review Ct Head Wo Contrast  01/13/2016  CLINICAL DATA:  50 year old male with unsteady gait and dizziness. EXAM: CT HEAD WITHOUT CONTRAST TECHNIQUE: Contiguous axial images were obtained from the base of the skull through the vertex without intravenous contrast. COMPARISON:  Head CT dated 10/31/2014 FINDINGS: There is slight prominence of the ventricles and sulci compatible with age-related volume loss. Mild periventricular and deep white matter hypodensities represent chronic microvascular ischemic changes. There is no intracranial hemorrhage. No mass effect or midline shift identified. There is mild mucoperiosteal thickening of paranasal sinuses. No air-fluid levels. The mastoid air cells are clear. The calvarium is intact. IMPRESSION: No acute intracranial hemorrhage. Mild age-related atrophy and chronic microvascular ischemic disease. If symptoms persist and there are no contraindications, MRI may provide better evaluation if clinically indicated Electronically Signed   By: Anner Crete M.D.   On: 01/13/2016 22:39   I have personally reviewed and  evaluated these images and lab results as part of my medical decision-making.   EKG Interpretation None      MDM   Final diagnoses:  Syncope and collapse   Mr. Vollbrecht is a 50 year old male with history of chronic alcoholism presenting today for syncope. He has a hard time recalling what happened before he passed out. Interrogation of his [employer] loop recorder demonstrates no cardiac episodes since April 2016. EKG on arrival showed normal sinus rhythm with no evidence of ischemia, abnormal intervals, or dysrhythmia. Initial troponin was undetectable. CT of the head showed chronic ischemic microvascular changes with cerebral atrophy but no evidence of acute intracranial pathology. CBC was unremarkable. Urinalysis was unremarkable. Blood alcohol level 44 but patient was clinically sober and was able to ambulate without an unsteady gait on discharge.  Patient was encouraged to stay overnight for further observation given syncope with history of stroke and CHF. Patient instead wanted to be discharged and was discharged AMA. He was warned of the potential risks of discharge including permanent impairment or death and acknowledge those risks. Patient was also encouraged to seek help for his drinking and patient states he plans to seek help this week.  It's possible he experienced a vasovagal episode as he had just come back in here conditioning after pain outside to take the trash out. I do not think he suffered a seizure as he had no postictal state with no evidence of incontinence upon awakening. He states he drank fewer beers than usual today (he can sometimes have up to 20 in a day). It's possible that drinking contributed to his syncope episode but as he arrived clinically sober and think there is a low likelihood for this. He was instructed to follow up with cardiology in the next week and to return if he has any concerns or further symptoms.  Allie Bossier, MD 01/14/16 [id number]  Julianne Rice,  MD 01/16/16 [phone]

## 2016-01-13 NOTE — ED Notes (Signed)
Dr. Doree Albee at bedside

## 2016-01-13 NOTE — ED Notes (Signed)
Yellow socks and yellow band applied to pt.

## 2016-01-13 NOTE — ED Notes (Signed)
Pt states he drank about 6 beers today and then went outside to take out the trash when he came back into the house at 130pm he felt dizziness and "blacked out" pt states he has had intermittent blurry visio and dizziness since then. Pt is alert and ox4. Speech is clear. Pt denies hitting head. Pt has equal grip strengths. No drift noted.

## 2016-01-13 NOTE — ED Notes (Addendum)
Pt states that "I just feel swimmy headed, I just don't feel right, its just not normal". Pt states that "I'm an alcoholic, I only drink 3 or 4 times a week, I only drank I think 8 beers today, but I can drink sometimes 20".  Dr. Lita Mains and Dr. Doree Albee aware.

## 2016-01-14 LAB — RAPID URINE DRUG SCREEN, HOSP PERFORMED
AMPHETAMINES: NOT DETECTED
Barbiturates: NOT DETECTED
Benzodiazepines: POSITIVE — AB
Cocaine: NOT DETECTED
OPIATES: NOT DETECTED
Tetrahydrocannabinol: NOT DETECTED

## 2016-01-14 NOTE — ED Notes (Signed)
Pt verbalized understanding of risks associated with leaving AMA.

## 2016-01-14 NOTE — Discharge Instructions (Signed)

## 2016-01-16 LAB — CUP PACEART REMOTE DEVICE CHECK: MDC IDC SESS DTM: 20170623233646

## 2016-01-26 ENCOUNTER — Ambulatory Visit (INDEPENDENT_AMBULATORY_CARE_PROVIDER_SITE_OTHER): Payer: Managed Care, Other (non HMO) | Admitting: *Deleted

## 2016-01-26 DIAGNOSIS — I639 Cerebral infarction, unspecified: Secondary | ICD-10-CM

## 2016-01-26 NOTE — Progress Notes (Signed)
Carelink Summary Report / Loop Recorder 

## 2016-01-28 ENCOUNTER — Other Ambulatory Visit: Payer: Self-pay | Admitting: Medical

## 2016-02-12 LAB — CUP PACEART REMOTE DEVICE CHECK: MDC IDC SESS DTM: 20170724000739

## 2016-02-24 ENCOUNTER — Ambulatory Visit (INDEPENDENT_AMBULATORY_CARE_PROVIDER_SITE_OTHER): Payer: Managed Care, Other (non HMO) | Admitting: *Deleted

## 2016-02-24 DIAGNOSIS — I639 Cerebral infarction, unspecified: Secondary | ICD-10-CM

## 2016-02-25 NOTE — Progress Notes (Signed)
Carelink Summary Report / Loop Recorder 

## 2016-03-24 LAB — CUP PACEART REMOTE DEVICE CHECK: Date Time Interrogation Session: 20170823003730

## 2016-03-24 NOTE — Progress Notes (Signed)
Carelink summary report received. Battery status OK. Normal device function. No new symptom episodes, tachy episodes, brady, or pause episodes. No new AF episodes. Monthly summary reports and ROV/PRN 

## 2016-03-25 ENCOUNTER — Ambulatory Visit (INDEPENDENT_AMBULATORY_CARE_PROVIDER_SITE_OTHER): Payer: Managed Care, Other (non HMO) | Admitting: *Deleted

## 2016-03-25 DIAGNOSIS — I639 Cerebral infarction, unspecified: Secondary | ICD-10-CM

## 2016-03-26 NOTE — Progress Notes (Signed)
Carelink Summary Report / Loop Recorder 

## 2016-04-02 ENCOUNTER — Ambulatory Visit (INDEPENDENT_AMBULATORY_CARE_PROVIDER_SITE_OTHER): Payer: Managed Care, Other (non HMO) | Admitting: Medical

## 2016-04-02 ENCOUNTER — Encounter: Payer: Self-pay | Admitting: Medical

## 2016-04-02 ENCOUNTER — Telehealth: Payer: Self-pay | Admitting: Medical

## 2016-04-02 VITALS — BP 128/84 | HR 86 | Temp 97.6°F | Ht 66.0 in | Wt 239.4 lb

## 2016-04-02 DIAGNOSIS — D1720 Benign lipomatous neoplasm of skin and subcutaneous tissue of unspecified limb: Secondary | ICD-10-CM

## 2016-04-02 NOTE — Telephone Encounter (Signed)
Although I saw him today for acute issue, he is due back for med check, fasting f/u.  So lets get him in soon for 30 min med check.

## 2016-04-02 NOTE — Progress Notes (Signed)
Subjective: Chief Complaint  Patient presents with  . Cyst    x2 years,R side, sore and painful now,  was irritated when he took a 4-5 ride to the [place], thinks cyst is infected, has a blood clot below cyst  . Back Pain   Here for "cyst" on right side.  Been there 2 years.   In the last month it has gotten bigger, somewhat red.  Feels hot.   [place] and the cyst rubbed against the car seat, irritated it.  Nothing draining.  Squeezes it and nothing comes out.   Has had prior cyst on back that he got pus /cheesy material out.   Denies fever, not currently on blood thinners.  No other aggravating or relieving factors. No other complaint.  Past Medical History:  Diagnosis Date  . Anxiety   . Back pain   . Bipolar depression ([place])   . CHF (congestive heart failure) ([place])   . Congenital heart disease   . Congenital heart disease    ? self reported valve issue  . CVA (cerebral vascular accident) ([place])    embolic stroke [id number]  . Depression   . Essential hypertension   . GERD (gastroesophageal reflux disease)   . Headache   . History of ETOH abuse   . Substance abuse    ROS as in subjective  Objective: BP 128/84 (BP Location: Left Arm, Patient Position: Sitting, Cuff Size: Large)   Pulse 86   Temp 97.6 F (36.4 C) (Oral)   Ht 5\' 6"  (1.676 m)   Wt 239 lb 6.4 oz (108.6 kg)   SpO2 98%   BMI 38.64 kg/m   Gen: wd, wn, nad Right lateral thigh proximal third with 8+cm round raised pink/red mass, seems fluctuant but not warm or tender.  Seems somewhat spongy underneath.  No drainage, no port hole.    No surrounding erythema.  No other tenderness or deformity    Assessment: Encounter Diagnosis  Name Primary?  . Lipoma of lower extremity Yes     Plan: Referred to general surgery. discussed case with Dr. Redmond School supervising MD who also examined patient.   Dcarlos was seen today for cyst and back pain.  Diagnoses and all orders for this  visit:  Lipoma of lower extremity -     Ambulatory referral to General Surgery

## 2016-04-02 NOTE — Telephone Encounter (Signed)
Pt wife is going to call back to set up appt when she got to look up her schedule

## 2016-04-05 ENCOUNTER — Ambulatory Visit: Payer: Self-pay | Admitting: Surgery

## 2016-04-05 NOTE — H&P (Signed)
[name] 04/05/2016 2:05 PM Location: [facility] Patient #: [id number] DOB: 1966/05/06 Married / Language: [name] / Race: White Male  History of Present Illness [name]. Brent Kinn MD; 04/05/2016 4:41 PM) Patient words: Patient presents for [name] lipoma over his right posterior thigh. He had this [name] year ago noted during his umbilical hernia evaluation. He had his hernia fixed but did not wish to have [name] lipoma removed. It is becoming more red and painful after he sat on during [name] long car trip. It is not draining.  The patient is [name] 50 year old male.   Allergies ([name], CMA; 04/05/2016 2:05 PM) Codeine Sulfate *ANALGESICS - OPIOID*  Medication History ([name], CMA; 04/05/2016 2:05 PM) Diazepam (5MG  Tablet, Oral) Active. Aspirin (325MG  Tablet, Oral) Active. Atorvastatin Calcium (40MG  Tablet, Oral) Active. Benztropine Mesylate (1MG  Tablet, Oral) Active. Pantoprazole Sodium (40MG  Tablet DR, Oral) Active. Fenofibrate (54MG  Tablet, Oral) Active. Naltrexone HCl (50MG  Tablet, Oral) Active. Medications Reconciled    Vitals ([name] CMA; 04/05/2016 2:05 PM) 04/05/2016 2:05 PM Weight: 231 lb Height: 66in Body Surface Area: 2.13 m Body Mass Index: 37.28 kg/m  Temp.: 98.64F(Temporal)  Pulse: 76 (Regular)  BP: 128/80 (Sitting, Left Arm, Standard)      Physical Exam ([name]. Brent Ausley MD; 04/05/2016 4:42 PM)  General Mental Status-Alert. General Appearance-Consistent with stated age. Hydration-Well hydrated. Voice-Normal.  Integumentary Note: 5 cm painful lipoma right posterior thigh mobile. Chronic inflammation noted. No signs of infection.  Chest and Lung Exam Chest and lung exam reveals -quiet, even and easy respiratory effort with no use of accessory muscles and on auscultation, normal breath sounds, no adventitious sounds and normal vocal resonance. Inspection Chest Wall - Normal. Back -  normal.  Cardiovascular Cardiovascular examination reveals -on palpation PMI is normal in location and amplitude, no palpable S3 or S4. Normal cardiac borders., normal heart sounds, regular rate and rhythm with no murmurs, carotid auscultation reveals no bruits and normal pedal pulses bilaterally.  Abdomen Note: Soft nontender obese umbilical hernia repair noted  Musculoskeletal Normal Exam - Left-Upper Extremity Strength Normal and Lower Extremity Strength Normal. Normal Exam - Right-Upper Extremity Strength Normal, Lower Extremity Weakness.    Assessment & Plan ([name]. Brent Maniaci MD; 04/05/2016 4:43 PM)  LIPOMA (D17.9) Impression: right thigh lipoma painful and patient desires excision  Risk of bleeding, infection, nerve injury, blood vessel injury, exacerbation of underlying medical problems, DVT, death, pulmonary complications, and the need for other procedures discussed. Recommend alcohol cessation. He agrees to proceed.  Current Plans Pt Education - [facility] Free Text Education/Instructions: discussed with patient and provided information. LOW BACK PAIN (M54.5) Impression: Refer to surgeon for evaluation

## 2016-04-18 LAB — CUP PACEART REMOTE DEVICE CHECK: MDC IDC SESS DTM: 20170922003813

## 2016-04-18 NOTE — Progress Notes (Signed)
Carelink summary report received. Battery status OK. Normal device function. No new symptom episodes, tachy episodes, brady, or pause episodes. No new AF episodes. Monthly summary reports and ROV/PRN 

## 2016-04-22 ENCOUNTER — Telehealth: Payer: Self-pay | Admitting: Cardiology

## 2016-04-22 NOTE — Telephone Encounter (Signed)
LMOVM requesting that pt send manual transmission b/c home monitor has not updated in at least 14 days.    

## 2016-04-26 ENCOUNTER — Ambulatory Visit (INDEPENDENT_AMBULATORY_CARE_PROVIDER_SITE_OTHER): Payer: Managed Care, Other (non HMO) | Admitting: *Deleted

## 2016-04-26 DIAGNOSIS — I639 Cerebral infarction, unspecified: Secondary | ICD-10-CM

## 2016-04-26 NOTE — Progress Notes (Signed)
Carelink Summary Report / Loop Recorder 

## 2016-04-28 ENCOUNTER — Encounter (HOSPITAL_BASED_OUTPATIENT_CLINIC_OR_DEPARTMENT_OTHER): Payer: Self-pay | Admitting: *Deleted

## 2016-04-28 ENCOUNTER — Encounter: Payer: Self-pay | Admitting: Cardiology

## 2016-05-03 ENCOUNTER — Telehealth: Payer: Self-pay | Admitting: *Deleted

## 2016-05-03 ENCOUNTER — Other Ambulatory Visit: Payer: Self-pay | Admitting: Medical

## 2016-05-03 NOTE — Telephone Encounter (Signed)
Spoke with patient's wife and requested a manual [employer] transmission for review.  Walked patient's wife through transmission process.  She verbalizes understanding of steps and denies additional questions or concerns at this time.  She agrees to call tomorrow if she experiences any difficulty.

## 2016-05-06 ENCOUNTER — Ambulatory Visit (HOSPITAL_BASED_OUTPATIENT_CLINIC_OR_DEPARTMENT_OTHER): Payer: Managed Care, Other (non HMO) | Admitting: Anesthesiology

## 2016-05-06 ENCOUNTER — Encounter (HOSPITAL_BASED_OUTPATIENT_CLINIC_OR_DEPARTMENT_OTHER): Payer: Self-pay | Admitting: Anesthesiology

## 2016-05-06 ENCOUNTER — Encounter (HOSPITAL_BASED_OUTPATIENT_CLINIC_OR_DEPARTMENT_OTHER)
Admission: RE | Disposition: A | Discharge status: Home / Self Care | Payer: Self-pay | Source: Ambulatory Visit | Attending: Surgery

## 2016-05-06 ENCOUNTER — Ambulatory Visit (HOSPITAL_BASED_OUTPATIENT_CLINIC_OR_DEPARTMENT_OTHER)
Admission: RE | Admit: 2016-05-06 | Discharge: 2016-05-06 | Disposition: A | Discharge status: Home / Self Care | Payer: Managed Care, Other (non HMO) | Source: Ambulatory Visit | Attending: Surgery | Admitting: Surgery

## 2016-05-06 DIAGNOSIS — I509 Heart failure, unspecified: Secondary | ICD-10-CM | POA: Insufficient documentation

## 2016-05-06 DIAGNOSIS — L720 Epidermal cyst: Secondary | ICD-10-CM | POA: Insufficient documentation

## 2016-05-06 DIAGNOSIS — I739 Peripheral vascular disease, unspecified: Secondary | ICD-10-CM | POA: Insufficient documentation

## 2016-05-06 DIAGNOSIS — M545 Low back pain: Secondary | ICD-10-CM | POA: Insufficient documentation

## 2016-05-06 DIAGNOSIS — I110 Hypertensive heart disease with heart failure: Secondary | ICD-10-CM | POA: Diagnosis not present

## 2016-05-06 DIAGNOSIS — K219 Gastro-esophageal reflux disease without esophagitis: Secondary | ICD-10-CM | POA: Insufficient documentation

## 2016-05-06 HISTORY — DX: Other specified behavioral and emotional disorders with onset usually occurring in childhood and adolescence: F98.8

## 2016-05-06 HISTORY — PX: LIPOMA EXCISION: SHX5283

## 2016-05-06 SURGERY — EXCISION LIPOMA
Anesthesia: General | Site: Thigh | Laterality: Right

## 2016-05-06 MED ORDER — MIDAZOLAM HCL 5 MG/5ML IJ SOLN
INTRAMUSCULAR | Status: DC | PRN
  Administered 2016-05-06: 2 mg via INTRAVENOUS

## 2016-05-06 MED ORDER — OXYCODONE-ACETAMINOPHEN 5-325 MG PO TABS: 1.0000 | ORAL_TABLET | ORAL | 0 refills | Status: DC | PRN

## 2016-05-06 MED ORDER — MIDAZOLAM HCL 2 MG/2ML IJ SOLN: 1.0000 mg | INTRAMUSCULAR | Status: DC | PRN

## 2016-05-06 MED ORDER — CEFAZOLIN SODIUM-DEXTROSE 2-4 GM/100ML-% IV SOLN
INTRAVENOUS | Status: AC
  Filled 2016-05-06: qty 100

## 2016-05-06 MED ORDER — LIDOCAINE HCL (CARDIAC) 20 MG/ML IV SOLN
INTRAVENOUS | Status: DC | PRN
  Administered 2016-05-06: 30 mg via INTRAVENOUS

## 2016-05-06 MED ORDER — BACITRACIN-NEOMYCIN-POLYMYXIN OINTMENT TUBE
TOPICAL_OINTMENT | CUTANEOUS | Status: DC | PRN
  Administered 2016-05-06: 1 via TOPICAL

## 2016-05-06 MED ORDER — FENTANYL CITRATE (PF) 100 MCG/2ML IJ SOLN
INTRAMUSCULAR | Status: DC | PRN
Start: 2016-05-06 — End: 2016-05-06
  Administered 2016-05-06: 100 ug via INTRAVENOUS

## 2016-05-06 MED ORDER — CHLORHEXIDINE GLUCONATE CLOTH 2 % EX PADS: 6.0000 | MEDICATED_PAD | Freq: Once | CUTANEOUS | Status: DC

## 2016-05-06 MED ORDER — EPHEDRINE SULFATE 50 MG/ML IJ SOLN
INTRAMUSCULAR | Status: DC | PRN
  Administered 2016-05-06 (×2): 10 mg via INTRAVENOUS

## 2016-05-06 MED ORDER — FENTANYL CITRATE (PF) 100 MCG/2ML IJ SOLN
INTRAMUSCULAR | Status: AC
  Filled 2016-05-06: qty 2

## 2016-05-06 MED ORDER — DOXYCYCLINE HYCLATE 50 MG PO CAPS: 100.0000 mg | ORAL_CAPSULE | Freq: Two times a day (BID) | ORAL | 0 refills | Status: DC

## 2016-05-06 MED ORDER — FENTANYL CITRATE (PF) 100 MCG/2ML IJ SOLN: 25.0000 ug | INTRAMUSCULAR | Status: DC | PRN

## 2016-05-06 MED ORDER — FENTANYL CITRATE (PF) 100 MCG/2ML IJ SOLN: 50.0000 ug | INTRAMUSCULAR | Status: DC | PRN

## 2016-05-06 MED ORDER — SUCCINYLCHOLINE CHLORIDE 200 MG/10ML IV SOSY
PREFILLED_SYRINGE | INTRAVENOUS | Status: AC
  Filled 2016-05-06: qty 10

## 2016-05-06 MED ORDER — BACITRACIN ZINC 500 UNIT/GM EX OINT
TOPICAL_OINTMENT | CUTANEOUS | Status: AC
  Filled 2016-05-06: qty 28.35

## 2016-05-06 MED ORDER — DEXTROSE 5 % IV SOLN
3.0000 g | INTRAVENOUS | Status: AC
  Administered 2016-05-06: 2 g via INTRAVENOUS

## 2016-05-06 MED ORDER — MIDAZOLAM HCL 2 MG/2ML IJ SOLN
INTRAMUSCULAR | Status: AC
  Filled 2016-05-06: qty 2

## 2016-05-06 MED ORDER — PROPOFOL 10 MG/ML IV BOLUS
INTRAVENOUS | Status: AC
  Filled 2016-05-06: qty 20

## 2016-05-06 MED ORDER — LACTATED RINGERS IV SOLN
INTRAVENOUS | Status: DC
  Administered 2016-05-06 (×2): via INTRAVENOUS

## 2016-05-06 MED ORDER — DEXAMETHASONE SODIUM PHOSPHATE 4 MG/ML IJ SOLN
INTRAMUSCULAR | Status: DC | PRN
  Administered 2016-05-06: 10 mg via INTRAVENOUS

## 2016-05-06 MED ORDER — SCOPOLAMINE 1 MG/3DAYS TD PT72
1.0000 | MEDICATED_PATCH | Freq: Once | TRANSDERMAL | Status: DC | PRN
Start: 2016-05-06 — End: 2016-05-06

## 2016-05-06 MED ORDER — ONDANSETRON HCL 4 MG PO TABS: 4.0000 mg | ORAL_TABLET | Freq: Three times a day (TID) | ORAL | 0 refills | Status: DC | PRN

## 2016-05-06 MED ORDER — EPHEDRINE 5 MG/ML INJ
INTRAVENOUS | Status: AC
  Filled 2016-05-06: qty 10

## 2016-05-06 MED ORDER — PROMETHAZINE HCL 25 MG/ML IJ SOLN: 6.2500 mg | INTRAMUSCULAR | Status: DC | PRN

## 2016-05-06 MED ORDER — PHENYLEPHRINE HCL 10 MG/ML IJ SOLN
INTRAMUSCULAR | Status: DC | PRN
  Administered 2016-05-06 (×4): 80 ug via INTRAVENOUS

## 2016-05-06 MED ORDER — PHENYLEPHRINE 40 MCG/ML (10ML) SYRINGE FOR IV PUSH (FOR BLOOD PRESSURE SUPPORT)
PREFILLED_SYRINGE | INTRAVENOUS | Status: AC
  Filled 2016-05-06: qty 10

## 2016-05-06 MED ORDER — GLYCOPYRROLATE 0.2 MG/ML IJ SOLN
0.2000 mg | Freq: Once | INTRAMUSCULAR | Status: AC | PRN
  Administered 2016-05-06: 0.2 mg via INTRAVENOUS

## 2016-05-06 MED ORDER — BUPIVACAINE-EPINEPHRINE 0.5% -1:200000 IJ SOLN
INTRAMUSCULAR | Status: DC | PRN
  Administered 2016-05-06: 16 mL

## 2016-05-06 MED ORDER — GLYCOPYRROLATE 0.2 MG/ML IV SOSY
PREFILLED_SYRINGE | INTRAVENOUS | Status: AC
  Filled 2016-05-06: qty 3

## 2016-05-06 MED ORDER — ONDANSETRON HCL 4 MG/2ML IJ SOLN
INTRAMUSCULAR | Status: AC
  Filled 2016-05-06: qty 2

## 2016-05-06 MED ORDER — LIDOCAINE 2% (20 MG/ML) 5 ML SYRINGE
INTRAMUSCULAR | Status: AC
  Filled 2016-05-06: qty 5

## 2016-05-06 MED ORDER — DEXAMETHASONE SODIUM PHOSPHATE 10 MG/ML IJ SOLN
INTRAMUSCULAR | Status: AC
  Filled 2016-05-06: qty 1

## 2016-05-06 MED ORDER — PROPOFOL 10 MG/ML IV BOLUS
INTRAVENOUS | Status: DC | PRN
  Administered 2016-05-06: 200 mg via INTRAVENOUS
  Administered 2016-05-06: 100 mg via INTRAVENOUS

## 2016-05-06 SURGICAL SUPPLY — 46 items
ADH SKN CLS APL DERMABOND .7 (GAUZE/BANDAGES/DRESSINGS)
APL SKNCLS STERI-STRIP NONHPOA (GAUZE/BANDAGES/DRESSINGS)
BENZOIN TINCTURE PRP APPL 2/3 (GAUZE/BANDAGES/DRESSINGS) IMPLANT
BLADE SURG 10 STRL SS (BLADE) ×2 IMPLANT
BLADE SURG 15 STRL LF DISP TIS (BLADE) ×1 IMPLANT
BLADE SURG 15 STRL SS (BLADE)
CANISTER SUCT 1200ML W/VALVE (MISCELLANEOUS) ×2 IMPLANT
CHLORAPREP W/TINT 26ML (MISCELLANEOUS) ×3 IMPLANT
CLOSURE WOUND 1/2 X4 (GAUZE/BANDAGES/DRESSINGS)
COVER BACK TABLE 60X90IN (DRAPES) ×3 IMPLANT
COVER MAYO STAND STRL (DRAPES) ×3 IMPLANT
DERMABOND ADVANCED (GAUZE/BANDAGES/DRESSINGS)
DERMABOND ADVANCED .7 DNX12 (GAUZE/BANDAGES/DRESSINGS) IMPLANT
DRAPE LAPAROTOMY 100X72 PEDS (DRAPES) ×3 IMPLANT
DRAPE UTILITY XL STRL (DRAPES) ×3 IMPLANT
DRSG PAD ABDOMINAL 8X10 ST (GAUZE/BANDAGES/DRESSINGS) ×2 IMPLANT
ELECT COATED BLADE 2.86 ST (ELECTRODE) ×3 IMPLANT
ELECT REM PT RETURN 9FT ADLT (ELECTROSURGICAL) ×3
ELECTRODE REM PT RTRN 9FT ADLT (ELECTROSURGICAL) ×1 IMPLANT
GAUZE SPONGE 4X4 12PLY STRL (GAUZE/BANDAGES/DRESSINGS) ×2 IMPLANT
GLOVE BIOGEL PI IND STRL 8 (GLOVE) ×1 IMPLANT
GLOVE BIOGEL PI INDICATOR 8 (GLOVE) ×2
GLOVE ECLIPSE 8.0 STRL XLNG CF (GLOVE) ×3 IMPLANT
GOWN STRL REUS W/ TWL LRG LVL3 (GOWN DISPOSABLE) ×2 IMPLANT
GOWN STRL REUS W/TWL LRG LVL3 (GOWN DISPOSABLE) ×6
NDL HYPO 25X1 1.5 SAFETY (NEEDLE) ×1 IMPLANT
NEEDLE HYPO 25X1 1.5 SAFETY (NEEDLE) ×3 IMPLANT
NS IRRIG 1000ML POUR BTL (IV SOLUTION) IMPLANT
PACK BASIN DAY SURGERY FS (CUSTOM PROCEDURE TRAY) ×3 IMPLANT
PENCIL BUTTON HOLSTER BLD 10FT (ELECTRODE) ×3 IMPLANT
SLEEVE SCD COMPRESS KNEE MED (MISCELLANEOUS) ×3 IMPLANT
SPONGE LAP 4X18 X RAY DECT (DISPOSABLE) ×2 IMPLANT
STAPLER VISISTAT 35W (STAPLE) IMPLANT
STRIP CLOSURE SKIN 1/2X4 (GAUZE/BANDAGES/DRESSINGS) IMPLANT
SUT ETHILON 2 0 FS 18 (SUTURE) ×2 IMPLANT
SUT MON AB 4-0 PC3 18 (SUTURE) ×1 IMPLANT
SUT VIC AB 0 CT1 27 (SUTURE) ×3
SUT VIC AB 0 CT1 27XBRD ANBCTR (SUTURE) IMPLANT
SUT VICRYL 3-0 CR8 SH (SUTURE) IMPLANT
SUT VICRYL AB 3 0 TIES (SUTURE) IMPLANT
SYR CONTROL 10ML LL (SYRINGE) ×3 IMPLANT
TOWEL OR 17X24 6PK STRL BLUE (TOWEL DISPOSABLE) ×6 IMPLANT
TOWEL OR NON WOVEN STRL DISP B (DISPOSABLE) ×1 IMPLANT
TUBE CONNECTING 20'X1/4 (TUBING) ×1
TUBE CONNECTING 20X1/4 (TUBING) ×1 IMPLANT
YANKAUER SUCT BULB TIP NO VENT (SUCTIONS) ×2 IMPLANT

## 2016-05-06 NOTE — Transfer of Care (Signed)
Immediate Anesthesia Transfer of Care Note  Patient: [name]  Procedure(s) Performed: Procedure(s): EXCISION LIPOMA RIGHT THIGH POSTERIOR (Right)  Patient Location: PACU  Anesthesia Type:General  Level of Consciousness: awake and patient cooperative  Airway & Oxygen Therapy: Patient Spontanous Breathing and Patient connected to face mask oxygen  Post-op Assessment: Report given to RN and Post -op Vital signs reviewed and stable  Post vital signs: Reviewed and stable  Last Vitals:  Vitals:   05/06/16 0814  BP: 121/86  Pulse: 91  Resp: 18  Temp: 36.6 C    Last Pain:  Vitals:   05/06/16 0814  TempSrc: Oral      Patients Stated Pain Goal: 0 ([id number])  Complications: No apparent anesthesia complications

## 2016-05-06 NOTE — Discharge Instructions (Signed)
Apply neosporin daily Cover with dry gauze  Shower daily Stitches stay in for 2 weeks    Call your surgeon if you experience:   1.  Fever over 101.0. 2.  Inability to urinate. 3.  Nausea and/or vomiting. 4.  Extreme swelling or bruising at the surgical site. 5.  Continued bleeding from the incision. 6.  Increased pain, redness or drainage from the incision. 7.  Problems related to your pain medication. 8.  Any problems and/or concerns    Post Anesthesia Home Care Instructions  Activity: Get plenty of rest for the remainder of the day. A responsible adult should stay with you for 24 hours following the procedure.  For the next 24 hours, DO NOT: -Drive a car -Paediatric nurse -Drink alcoholic beverages -Take any medication unless instructed by your physician -Make any legal decisions or sign important papers.  Meals: Start with liquid foods such as gelatin or soup. Progress to regular foods as tolerated. Avoid greasy, spicy, heavy foods. If nausea and/or vomiting occur, drink only clear liquids until the nausea and/or vomiting subsides. Call your physician if vomiting continues.  Special Instructions/Symptoms: Your throat may feel dry or sore from the anesthesia or the breathing tube placed in your throat during surgery. If this causes discomfort, gargle with warm salt water. The discomfort should disappear within 24 hours.  If you had a scopolamine patch placed behind your ear for the management of post- operative nausea and/or vomiting:  1. The medication in the patch is effective for 72 hours, after which it should be removed.  Wrap patch in a tissue and discard in the trash. Wash hands thoroughly with soap and water. 2. You may remove the patch earlier than 72 hours if you experience unpleasant side effects which may include dry mouth, dizziness or visual disturbances. 3. Avoid touching the patch. Wash your hands with soap and water after contact with the patch.

## 2016-05-06 NOTE — Interval H&P Note (Signed)
History and Physical Interval Note:  05/06/2016 8:57 AM  [name]  has presented today for surgery, with the diagnosis of LIPOMA RIGHT THIGH POSTERIOR  The various methods of treatment have been discussed with the patient and family. After consideration of risks, benefits and other options for treatment, the patient has consented to  Procedure(s): EXCISION LIPOMA RIGHT THIGH POSTERIOR (Right) as a surgical intervention .  The patient's history has been reviewed, patient examined, no change in status, stable for surgery.  I have reviewed the patient's chart and labs.  Questions were answered to the patient's satisfaction.     [name] A.

## 2016-05-06 NOTE — Anesthesia Preprocedure Evaluation (Addendum)
Anesthesia Evaluation  Patient identified by MRN, date of birth, ID band Patient awake    Reviewed: Allergy & Precautions, NPO status , Patient's Chart, lab work & pertinent test results  Airway Mallampati: II  TM Distance: >3 FB Neck ROM: Full    Dental  (+) Teeth Intact, Dental Advisory Given, Upper Dentures, Partial Lower   Pulmonary Current Smoker,    Pulmonary exam normal breath sounds clear to auscultation       Cardiovascular hypertension, + Peripheral Vascular Disease and +CHF  Normal cardiovascular exam Rhythm:Regular Rate:Normal  Echo 4/16: Study Conclusions  - Left ventricle: The cavity size was normal. Wall thickness was increased in a pattern of mild LVH. The estimated ejection fraction was 60%. Wall motion was normal; there were no regional wall motion abnormalities. - Left atrium: The atrium was mildly dilated. - Right ventricle: The cavity size was normal. Systolic function was normal. - Impressions: No cardiac source of embolism was identified, but cannot be ruled out on the basis of this examination.   Neuro/Psych PSYCHIATRIC DISORDERS Anxiety Depression Bipolar Disorder CVA, Residual Symptoms    GI/Hepatic GERD  Medicated,(+)     substance abuse  alcohol use,   Endo/Other  Obesity   Renal/GU negative Renal ROS     Musculoskeletal negative musculoskeletal ROS (+)   Abdominal   Peds  (+) ATTENTION DEFICIT DISORDER WITHOUT HYPERACTIVITY Hematology negative hematology ROS (+)   Anesthesia Other Findings Day of surgery medications reviewed with the patient.  Reproductive/Obstetrics                           Anesthesia Physical Anesthesia Plan  ASA: III  Anesthesia Plan: General   Post-op Pain Management:    Induction: Intravenous  Airway Management Planned: Oral ETT  Additional Equipment:   Intra-op Plan:   Post-operative Plan: Extubation in OR  Informed  Consent: I have reviewed the patients History and Physical, chart, labs and discussed the procedure including the risks, benefits and alternatives for the proposed anesthesia with the patient or authorized representative who has indicated his/her understanding and acceptance.   Dental advisory given  Plan Discussed with: CRNA  Anesthesia Plan Comments: (Risks/benefits of general anesthesia discussed with patient including risk of damage to teeth, lips, gum, and tongue, nausea/vomiting, allergic reactions to medications, and the possibility of heart attack, stroke and death.  All patient questions answered.  Patient wishes to proceed.)        Anesthesia Quick Evaluation

## 2016-05-06 NOTE — Op Note (Signed)
Preoperative diagnosis 5 cm right posterior thigh mass  Postoperative diagnosis: 5 cm posterior thigh epidermal inclusion cyst  Procedure: Excision of right posterior thigh mass with a centimeter intermediate closure  Surgeon: Erroll Luna M.D.  Anesthesia: LMA with 0.5% Sensorcaine plain local  EBL: Minimal  Specimen: Right 5 mass to pathology  Drains: None  EBL: Minimal  Indications for procedure: The patient is a long-standing posterior right thigh mass. Is becoming more uncomfortable. This felt like a lipoma showed signs of inflammation. He desired excision of this. Risk of bleeding, infection, nerve injury, wound complications, wound infection, cosmetic deformity, the need for further surgery, DVT, anesthesia risks, death, need for other treatments and/or procedures discussed. He agreed to proceed.  Description of procedure: The patient was met in the holding area and the mass was marked on his posterior right thigh. Questions were answered. He is taken back the operating room and placed supine on the OR table. After induction of general anesthesia he was on a beanbag and a roll was left side to expose the right posterior thigh mass. He was appropriately padded. After he was positioned the area was prepped and draped in sterile fashion. Timeout was done. The mass was easily seen. He was injected circumferentially with local anesthetic. Curvilinear incision was made in the center of the mass. Upon dissection this appeared to be a very large deep epidermal inclusion cyst. Entire cyst excised in its entirety stasis was achieved. Due to its large size intermediate closure was done. The skin was mobilized circumferentially for 3 cm in all directions. The subcutaneous fat was then closed into the defect to fill the defect. 2-0 Nylon sutures were used using a vertical mattress to close the skin. Antibiotic ointment was placed. A large bulky dressing was placed over this. All final counts are  found to be correct. The patient was then rolled back supine extubated taken to recovery in satisfactory condition.

## 2016-05-06 NOTE — Interval H&P Note (Signed)
History and Physical Interval Note:  05/06/2016 8:47 AM  [name]  has presented today for surgery, with the diagnosis of LIPOMA RIGHT THIGH POSTERIOR  The various methods of treatment have been discussed with the patient and family. After consideration of risks, benefits and other options for treatment, the patient has consented to  Procedure(s): EXCISION LIPOMA RIGHT THIGH POSTERIOR (Right) as a surgical intervention .  The patient's history has been reviewed, patient examined, no change in status, stable for surgery.  I have reviewed the patient's chart and labs.  Questions were answered to the patient's satisfaction.     Aishani Kalis A.

## 2016-05-06 NOTE — Anesthesia Postprocedure Evaluation (Signed)
Anesthesia Post Note  Patient: [name]  Procedure(s) Performed: Procedure(s) (LRB): EXCISION LIPOMA RIGHT THIGH POSTERIOR (Right)  Patient location during evaluation: PACU Anesthesia Type: General Level of consciousness: awake and alert Pain management: pain level controlled Vital Signs Assessment: post-procedure vital signs reviewed and stable Respiratory status: spontaneous breathing, nonlabored ventilation, respiratory function stable and patient connected to nasal cannula oxygen Cardiovascular status: blood pressure returned to baseline and stable Postop Assessment: no signs of nausea or vomiting Anesthetic complications: no    Last Vitals:  Vitals:   05/06/16 1045 05/06/16 1105  BP:  (!) 142/92  Pulse: 95 83  Resp: 18 18  Temp:  36.6 C    Last Pain:  Vitals:   05/06/16 1105  TempSrc: Oral  PainSc: 0-No pain                 Catalina Gravel

## 2016-05-06 NOTE — Anesthesia Procedure Notes (Signed)
Procedure Name: Intubation Date/Time: 05/06/2016 9:20 AM Performed by: Marrianne Mood Pre-anesthesia Checklist: Patient identified, Emergency Drugs available, Suction available, Patient being monitored and Timeout performed Patient Re-evaluated:Patient Re-evaluated prior to inductionOxygen Delivery Method: Circle system utilized Preoxygenation: Pre-oxygenation with 100% oxygen Intubation Type: IV induction Ventilation: Mask ventilation without difficulty Laryngoscope Size: Mac, 4 and Glidescope Grade View: Grade III Tube type: Oral Tube size: 8.0 mm Number of attempts: 1 Airway Equipment and Method: Stylet and Oral airway Placement Confirmation: ETT inserted through vocal cords under direct vision,  positive ETCO2 and breath sounds checked- equal and bilateral Secured at: 21 cm Tube secured with: Tape Dental Injury: Teeth and Oropharynx as per pre-operative assessment  Difficulty Due To: Difficult Airway- due to limited oral opening, Difficult Airway- due to dentition, Difficult Airway- due to reduced neck mobility and Difficulty was anticipated

## 2016-05-06 NOTE — H&P (Signed)
Pre-op/Pre-procedure Orders Open     [facility]  Brent Luna, MD  General Surgery   Additional Documentation   Encounter Info:   Billing Info,   History,   Allergies,   Detailed Report     All Notes   H&P by Brent Luna, MD at 10PM   Author: Erroll Luna, MD Author Type: Physician Filed:   Note Status: Signed Cosign: Cosign Not Required Encounter Date:   Editor: Brent Luna, MD (Physician)    [name]  Location: [facility] Patient #: [id number] DOB: 09/14/1965 Married / Language: English / Race: White Ma History of Present Illness [name] Patient words: Patient presents for a lipoma over his right posterior thigh. He had this a year ago noted during his umbilical hernia evaluation. He had his hernia fixed but did not wish to have a lipoma removed. It is becoming more red and painful after he sat on during a long car trip. It is not draining.  The patient is a 50 year old male.   Allergies (Sonya Bynum, CMA; Codeine Sulfate *ANALGESICS - OPIOID*  Medication History (Sonya Bynum, CMA; 1PM) Diazepam (5MG  Tablet, Oral) Active. Aspirin (325MG  Tablet, Oral) Active. Atorvastatin Calcium (40MG  Tablet, Oral) Active. Benztropine Mesylate (1MG  Tablet, Oral) Active. Pantoprazole Sodium (40MG  Tablet DR, Oral) Active. Fenofibrate (54MG  Tablet, Oral) Active. Naltrexone HCl (50MG  Tablet, Oral) Active. Medications Reconciled    Vitals (Sonya Bynum CMA;  PM) 04/05/2016 2:05 PM Weight: 231 lb Height: 66in Body Surface Area: 2.13 [name] Body Mass Index: 37.28 kg/[name]  Temp.: 98.86F(Temporal)  Pulse: 76 (Regular)  BP: 128/80 (Sitting, Left Arm, Standard)      Physical Exam ([name];   General Mental Status-Alert. General Appearance-Consistent with stated age. Hydration-Well hydrated. Voice-Normal.  Integumentary Note: 5 cm painful lipoma right posterior thigh mobile.  Chronic inflammation noted. No signs of infection.  Chest and Lung Exam Chest and lung exam reveals -quiet, even and easy respiratory effort with no use of accessory muscles and on auscultation, normal breath sounds, no adventitious sounds and normal vocal resonance. Inspection Chest Wall - Normal. Back - normal.  Cardiovascular Cardiovascular examination reveals -on palpation PMI is normal in location and amplitude, no palpable S3 or S4. Normal cardiac borders., normal heart sounds, regular rate and rhythm with no murmurs, carotid auscultation reveals no bruits and normal pedal pulses bilaterally.  Abdomen Note: Soft nontender obese umbilical hernia repair noted  Musculoskeletal Normal Exam - Left-Upper Extremity Strength Normal and Lower Extremity Strength Normal. Normal Exam - Right-Upper Extremity Strength Normal, Lower Extremity Weakness.    Assessment & Plan ([name];   LIPOMA (D17.9) Impression: right thigh lipoma painful and patient desires excision  Risk of bleeding, infection, nerve injury, blood vessel injury, exacerbation of underlying medical problems, DVT, death, pulmonary complications, and the need for other procedures discussed. Recommend alcohol cessation. He agrees to proceed.  Current Plans Pt Education - CCS Free Text Education/Instructions: discussed with patient and provided information. LOW BACK PAIN (M54.5) Impression: Refer to surgeon for evaluation

## 2016-05-07 ENCOUNTER — Encounter (HOSPITAL_BASED_OUTPATIENT_CLINIC_OR_DEPARTMENT_OTHER): Payer: Self-pay | Admitting: Surgery

## 2016-05-10 NOTE — Telephone Encounter (Signed)
Manual transmission received on 05/03/16.  Tachy episode from 04/17/16 printed and placed in [facility] folder for review by Dr. [name].

## 2016-05-22 LAB — CUP PACEART REMOTE DEVICE CHECK
Date Time Interrogation Session: 20171022023714
MDC IDC PG IMPLANT DT: 20160429

## 2016-05-22 NOTE — Progress Notes (Signed)
Carelink summary report received. Battery status OK. Normal device function. No new symptom episodes, tachy episodes, brady, or pause episodes. No new AF episodes. Monthly summary reports and ROV/PRN 

## 2016-05-24 ENCOUNTER — Ambulatory Visit (INDEPENDENT_AMBULATORY_CARE_PROVIDER_SITE_OTHER): Payer: Managed Care, Other (non HMO) | Admitting: *Deleted

## 2016-05-24 DIAGNOSIS — I639 Cerebral infarction, unspecified: Secondary | ICD-10-CM

## 2016-05-25 NOTE — Progress Notes (Signed)
Carelink Summary Report / Loop Recorder 

## 2016-06-23 ENCOUNTER — Ambulatory Visit (INDEPENDENT_AMBULATORY_CARE_PROVIDER_SITE_OTHER): Payer: Managed Care, Other (non HMO) | Admitting: *Deleted

## 2016-06-23 DIAGNOSIS — I639 Cerebral infarction, unspecified: Secondary | ICD-10-CM

## 2016-06-24 NOTE — Progress Notes (Signed)
Carelink Summary Report / Loop Recorder 

## 2016-07-06 LAB — CUP PACEART REMOTE DEVICE CHECK
Date Time Interrogation Session: 20171121040953
Implantable Pulse Generator Implant Date: 20160429

## 2016-07-14 ENCOUNTER — Emergency Department (HOSPITAL_COMMUNITY): Payer: Managed Care, Other (non HMO)

## 2016-07-14 ENCOUNTER — Encounter (HOSPITAL_COMMUNITY): Payer: Self-pay

## 2016-07-14 DIAGNOSIS — Z5321 Procedure and treatment not carried out due to patient leaving prior to being seen by health care provider: Secondary | ICD-10-CM | POA: Insufficient documentation

## 2016-07-14 DIAGNOSIS — R079 Chest pain, unspecified: Secondary | ICD-10-CM | POA: Insufficient documentation

## 2016-07-14 NOTE — ED Triage Notes (Signed)
Pt states that last week he got in an argument with his wife and the sheriff was called, today he was had several drinks, and he wants inpatient treatment cause he wants to be away from his wife.  Pt denies SI/HI/AVH, pt also c/o CP

## 2016-07-15 ENCOUNTER — Emergency Department (HOSPITAL_COMMUNITY)
Admission: EM | Admit: 2016-07-15 | Discharge: 2016-07-15 | Disposition: A | Discharge status: Home / Self Care | Payer: Managed Care, Other (non HMO) | Attending: Emergency Medicine | Admitting: Emergency Medicine

## 2016-07-15 LAB — BASIC METABOLIC PANEL
ANION GAP: 12 (ref 5–15)
BUN: 9 mg/dL (ref 6–20)
CALCIUM: 9.6 mg/dL (ref 8.9–10.3)
CO2: 25 mmol/L (ref 22–32)
Chloride: 104 mmol/L (ref 101–111)
Creatinine, Ser: 1.04 mg/dL (ref 0.61–1.24)
GLUCOSE: 89 mg/dL (ref 65–99)
Potassium: 4.2 mmol/L (ref 3.5–5.1)
Sodium: 141 mmol/L (ref 135–145)

## 2016-07-15 LAB — CBC
HCT: 46 % (ref 39.0–52.0)
HEMOGLOBIN: 15.2 g/dL (ref 13.0–17.0)
MCH: 29.5 pg (ref 26.0–34.0)
MCHC: 33 g/dL (ref 30.0–36.0)
MCV: 89.1 fL (ref 78.0–100.0)
PLATELETS: 276 10*3/uL (ref 150–400)
RBC: 5.16 MIL/uL (ref 4.22–5.81)
RDW: 14.3 % (ref 11.5–15.5)
WBC: 8.1 10*3/uL (ref 4.0–10.5)

## 2016-07-15 LAB — I-STAT TROPONIN, ED: TROPONIN I, POC: 0 ng/mL (ref 0.00–0.08)

## 2016-07-15 NOTE — ED Triage Notes (Signed)
The npt reports that he is leacing  3 minutes later the pts wife wants [place]  To go get him  She reports that he is walking downthe street.  She is calling the police

## 2016-07-15 NOTE — ED Notes (Signed)
The pt did not return

## 2016-07-23 ENCOUNTER — Ambulatory Visit (INDEPENDENT_AMBULATORY_CARE_PROVIDER_SITE_OTHER): Payer: Managed Care, Other (non HMO) | Admitting: *Deleted

## 2016-07-23 DIAGNOSIS — I639 Cerebral infarction, unspecified: Secondary | ICD-10-CM

## 2016-07-26 NOTE — Progress Notes (Signed)
Carelink Summary Report / Loop Recorder 

## 2016-07-28 ENCOUNTER — Other Ambulatory Visit: Payer: Self-pay | Admitting: Internal Medicine

## 2016-08-10 LAB — CUP PACEART REMOTE DEVICE CHECK
Date Time Interrogation Session: 20171221050812
MDC IDC PG IMPLANT DT: 20160429

## 2016-08-23 ENCOUNTER — Ambulatory Visit (INDEPENDENT_AMBULATORY_CARE_PROVIDER_SITE_OTHER): Payer: Managed Care, Other (non HMO) | Admitting: *Deleted

## 2016-08-23 ENCOUNTER — Encounter (HOSPITAL_COMMUNITY): Payer: Self-pay

## 2016-08-23 ENCOUNTER — Emergency Department (HOSPITAL_COMMUNITY)
Admission: EM | Admit: 2016-08-23 | Discharge: 2016-08-23 | Discharge status: Against Medical Advice | Payer: Managed Care, Other (non HMO) | Attending: Emergency Medicine | Admitting: Emergency Medicine

## 2016-08-23 DIAGNOSIS — F17210 Nicotine dependence, cigarettes, uncomplicated: Secondary | ICD-10-CM | POA: Diagnosis not present

## 2016-08-23 DIAGNOSIS — F10230 Alcohol dependence with withdrawal, uncomplicated: Secondary | ICD-10-CM | POA: Diagnosis present

## 2016-08-23 DIAGNOSIS — Z79899 Other long term (current) drug therapy: Secondary | ICD-10-CM | POA: Diagnosis not present

## 2016-08-23 DIAGNOSIS — I639 Cerebral infarction, unspecified: Secondary | ICD-10-CM | POA: Diagnosis not present

## 2016-08-23 DIAGNOSIS — Z7982 Long term (current) use of aspirin: Secondary | ICD-10-CM | POA: Diagnosis not present

## 2016-08-23 DIAGNOSIS — I10 Essential (primary) hypertension: Secondary | ICD-10-CM | POA: Insufficient documentation

## 2016-08-23 DIAGNOSIS — F909 Attention-deficit hyperactivity disorder, unspecified type: Secondary | ICD-10-CM | POA: Insufficient documentation

## 2016-08-23 DIAGNOSIS — Z5321 Procedure and treatment not carried out due to patient leaving prior to being seen by health care provider: Secondary | ICD-10-CM | POA: Diagnosis not present

## 2016-08-23 DIAGNOSIS — Z955 Presence of coronary angioplasty implant and graft: Secondary | ICD-10-CM | POA: Diagnosis not present

## 2016-08-23 DIAGNOSIS — Z8673 Personal history of transient ischemic attack (TIA), and cerebral infarction without residual deficits: Secondary | ICD-10-CM | POA: Diagnosis not present

## 2016-08-23 DIAGNOSIS — F1910 Other psychoactive substance abuse, uncomplicated: Secondary | ICD-10-CM

## 2016-08-23 LAB — CBC
HCT: 44.2 % (ref 39.0–52.0)
Hemoglobin: 14.7 g/dL (ref 13.0–17.0)
MCH: 29.8 pg (ref 26.0–34.0)
MCHC: 33.3 g/dL (ref 30.0–36.0)
MCV: 89.5 fL (ref 78.0–100.0)
Platelets: 262 10*3/uL (ref 150–400)
RBC: 4.94 MIL/uL (ref 4.22–5.81)
RDW: 14.2 % (ref 11.5–15.5)
WBC: 11.2 10*3/uL — AB (ref 4.0–10.5)

## 2016-08-23 LAB — RAPID URINE DRUG SCREEN, HOSP PERFORMED
Amphetamines: NOT DETECTED
BENZODIAZEPINES: POSITIVE — AB
Barbiturates: NOT DETECTED
COCAINE: NOT DETECTED
OPIATES: NOT DETECTED
Tetrahydrocannabinol: POSITIVE — AB

## 2016-08-23 LAB — COMPREHENSIVE METABOLIC PANEL
ALT: 41 U/L (ref 17–63)
ANION GAP: 10 (ref 5–15)
AST: 34 U/L (ref 15–41)
Albumin: 4.2 g/dL (ref 3.5–5.0)
Alkaline Phosphatase: 52 U/L (ref 38–126)
BILIRUBIN TOTAL: 0.9 mg/dL (ref 0.3–1.2)
BUN: 19 mg/dL (ref 6–20)
CHLORIDE: 105 mmol/L (ref 101–111)
CO2: 25 mmol/L (ref 22–32)
Calcium: 9.4 mg/dL (ref 8.9–10.3)
Creatinine, Ser: 1.2 mg/dL (ref 0.61–1.24)
Glucose, Bld: 105 mg/dL — ABNORMAL HIGH (ref 65–99)
POTASSIUM: 4.1 mmol/L (ref 3.5–5.1)
Sodium: 140 mmol/L (ref 135–145)
TOTAL PROTEIN: 7.4 g/dL (ref 6.5–8.1)

## 2016-08-23 LAB — ETHANOL

## 2016-08-23 NOTE — ED Triage Notes (Signed)
Pt states that he wants to be evaluated today for his temper and behavior issues. He also wants to detox from alcohol. He states that he denies SI/HI. He mentioned that he has thought about suicide, but states "I would never put my family through that. I don't want to do that." He came in TONIGHT, bc he got in to an argument with his wife and he feels like he is blamed for everything. He also gets really frustrated bc he never gets to leave his house bc he can't take his meds and drive. He states that his last drink was 2-3 days ago and he usually drinks about a case of beer. He denies any withdrawal symptoms at this time. A&Ox4.

## 2016-08-23 NOTE — ED Notes (Signed)
Pt just left, was busying helping pt in room.  Looked for pt, informed charge.

## 2016-08-23 NOTE — ED Notes (Signed)
Bed: [facility] Expected date:  Expected time:  Means of arrival:  Comments: 

## 2016-08-23 NOTE — ED Provider Notes (Cosign Needed)
11:19 PM Attempted to see patient. Hallway bed unoccupied. Patient's stickers on the bed. Bathrooms checked without evidence of patient. RN unaware of patient's presence in the department. Suspect elopement.   Antonietta Breach, PA-C 08/23/16 2320

## 2016-08-24 NOTE — Progress Notes (Signed)
Carelink Summary Report / Loop Recorder 

## 2016-08-26 LAB — CUP PACEART REMOTE DEVICE CHECK
Date Time Interrogation Session: 20180120054149
Implantable Pulse Generator Implant Date: 20160429

## 2016-08-27 LAB — CUP PACEART REMOTE DEVICE CHECK
Implantable Pulse Generator Implant Date: 20160429
MDC IDC SESS DTM: 20180219054242

## 2016-09-08 ENCOUNTER — Other Ambulatory Visit: Payer: Self-pay | Admitting: Medical

## 2016-09-09 NOTE — Telephone Encounter (Signed)
Called l/m for pt to call us back to make an appt

## 2016-09-09 NOTE — Telephone Encounter (Signed)
Declining med until he can schedule something

## 2016-09-22 ENCOUNTER — Ambulatory Visit (INDEPENDENT_AMBULATORY_CARE_PROVIDER_SITE_OTHER): Payer: Managed Care, Other (non HMO) | Admitting: *Deleted

## 2016-09-22 DIAGNOSIS — I639 Cerebral infarction, unspecified: Secondary | ICD-10-CM | POA: Diagnosis not present

## 2016-09-22 NOTE — Progress Notes (Signed)
Carelink Summary Report / Loop Recorder 

## 2016-10-04 LAB — CUP PACEART REMOTE DEVICE CHECK
Implantable Pulse Generator Implant Date: 20160429
MDC IDC SESS DTM: 20180321060717

## 2016-10-07 ENCOUNTER — Encounter (HOSPITAL_COMMUNITY): Payer: Self-pay

## 2016-10-07 ENCOUNTER — Emergency Department (HOSPITAL_COMMUNITY): Payer: Managed Care, Other (non HMO)

## 2016-10-07 ENCOUNTER — Emergency Department (HOSPITAL_COMMUNITY)
Admission: EM | Admit: 2016-10-07 | Discharge: 2016-10-07 | Disposition: A | Discharge status: Home / Self Care | Payer: Managed Care, Other (non HMO) | Attending: Emergency Medicine | Admitting: Emergency Medicine

## 2016-10-07 DIAGNOSIS — R110 Nausea: Secondary | ICD-10-CM

## 2016-10-07 DIAGNOSIS — I509 Heart failure, unspecified: Secondary | ICD-10-CM | POA: Insufficient documentation

## 2016-10-07 DIAGNOSIS — R42 Dizziness and giddiness: Secondary | ICD-10-CM | POA: Diagnosis present

## 2016-10-07 DIAGNOSIS — Z79899 Other long term (current) drug therapy: Secondary | ICD-10-CM | POA: Insufficient documentation

## 2016-10-07 DIAGNOSIS — F909 Attention-deficit hyperactivity disorder, unspecified type: Secondary | ICD-10-CM | POA: Insufficient documentation

## 2016-10-07 DIAGNOSIS — F17210 Nicotine dependence, cigarettes, uncomplicated: Secondary | ICD-10-CM | POA: Insufficient documentation

## 2016-10-07 DIAGNOSIS — Z8673 Personal history of transient ischemic attack (TIA), and cerebral infarction without residual deficits: Secondary | ICD-10-CM | POA: Diagnosis not present

## 2016-10-07 DIAGNOSIS — I110 Hypertensive heart disease with heart failure: Secondary | ICD-10-CM | POA: Insufficient documentation

## 2016-10-07 DIAGNOSIS — Z7982 Long term (current) use of aspirin: Secondary | ICD-10-CM | POA: Insufficient documentation

## 2016-10-07 LAB — COMPREHENSIVE METABOLIC PANEL
ALT: 39 U/L (ref 17–63)
ANION GAP: 12 (ref 5–15)
AST: 27 U/L (ref 15–41)
Albumin: 3.9 g/dL (ref 3.5–5.0)
Alkaline Phosphatase: 34 U/L — ABNORMAL LOW (ref 38–126)
BUN: 8 mg/dL (ref 6–20)
CHLORIDE: 102 mmol/L (ref 101–111)
CO2: 25 mmol/L (ref 22–32)
Calcium: 9.2 mg/dL (ref 8.9–10.3)
Creatinine, Ser: 1.09 mg/dL (ref 0.61–1.24)
Glucose, Bld: 85 mg/dL (ref 65–99)
POTASSIUM: 3.7 mmol/L (ref 3.5–5.1)
Sodium: 139 mmol/L (ref 135–145)
Total Bilirubin: 0.7 mg/dL (ref 0.3–1.2)
Total Protein: 6.8 g/dL (ref 6.5–8.1)

## 2016-10-07 LAB — RAPID URINE DRUG SCREEN, HOSP PERFORMED
Amphetamines: NOT DETECTED
BENZODIAZEPINES: POSITIVE — AB
Barbiturates: NOT DETECTED
COCAINE: NOT DETECTED
OPIATES: NOT DETECTED
Tetrahydrocannabinol: NOT DETECTED

## 2016-10-07 LAB — DIFFERENTIAL
BASOS PCT: 1 %
Basophils Absolute: 0 10*3/uL (ref 0.0–0.1)
EOS ABS: 0.3 10*3/uL (ref 0.0–0.7)
Eosinophils Relative: 5 %
Lymphocytes Relative: 28 %
Lymphs Abs: 1.8 10*3/uL (ref 0.7–4.0)
MONO ABS: 0.8 10*3/uL (ref 0.1–1.0)
MONOS PCT: 12 %
Neutro Abs: 3.6 10*3/uL (ref 1.7–7.7)
Neutrophils Relative %: 54 %

## 2016-10-07 LAB — URINALYSIS, ROUTINE W REFLEX MICROSCOPIC
Bilirubin Urine: NEGATIVE
Glucose, UA: NEGATIVE mg/dL
HGB URINE DIPSTICK: NEGATIVE
Ketones, ur: NEGATIVE mg/dL
Leukocytes, UA: NEGATIVE
Nitrite: NEGATIVE
Protein, ur: NEGATIVE mg/dL
SPECIFIC GRAVITY, URINE: 1.004 — AB (ref 1.005–1.030)
pH: 8 (ref 5.0–8.0)

## 2016-10-07 LAB — CBC
HCT: 43.3 % (ref 39.0–52.0)
Hemoglobin: 14.1 g/dL (ref 13.0–17.0)
MCH: 29.5 pg (ref 26.0–34.0)
MCHC: 32.6 g/dL (ref 30.0–36.0)
MCV: 90.6 fL (ref 78.0–100.0)
PLATELETS: 247 10*3/uL (ref 150–400)
RBC: 4.78 MIL/uL (ref 4.22–5.81)
RDW: 14.3 % (ref 11.5–15.5)
WBC: 6.6 10*3/uL (ref 4.0–10.5)

## 2016-10-07 LAB — ETHANOL: Alcohol, Ethyl (B): <5 mg/dL (ref <5)

## 2016-10-07 LAB — I-STAT TROPONIN, ED: TROPONIN I, POC: 0 ng/mL (ref 0.00–0.08)

## 2016-10-07 MED ORDER — LORAZEPAM 2 MG/ML IJ SOLN
1.0000 mg | Freq: Once | INTRAMUSCULAR | Status: AC
Start: 2016-10-07 — End: 2016-10-07
  Administered 2016-10-07: 1 mg via INTRAVENOUS
  Filled 2016-10-07: qty 1

## 2016-10-07 MED ORDER — LORAZEPAM 2 MG/ML IJ SOLN
1.0000 mg | Freq: Once | INTRAMUSCULAR | Status: AC
  Administered 2016-10-07: 1 mg via INTRAVENOUS
  Filled 2016-10-07: qty 1

## 2016-10-07 NOTE — ED Notes (Signed)
Pt sts he found someone to pick up his truck for him and pt sts he will stay to receive the MRI. Dr. Ralene Bathe aware

## 2016-10-07 NOTE — ED Triage Notes (Addendum)
Pt coming from [employer] meeting (pt 9 days sober) with ems. Pt friend at the meeting called EMS stating the pt has hx of stroke and "was not acting right". EMS reports no neuro deficits, pt having intermittent studdering. Pt A/ox4. Pt sts he started to feel nauseous and clammy while at his meeting and that is what happened from his last stroke. VSS Nad at this time. 20G R Hand.

## 2016-10-07 NOTE — ED Notes (Signed)
Patient transported to CT 

## 2016-10-07 NOTE — ED Notes (Signed)
Pt returned from MRI and conencted to the monitor

## 2016-10-07 NOTE — ED Notes (Signed)
Pt sts he came from an [place] meeting that [place]" pt sts he needs to leave to move his truck and he will come back. Dr Ralene Bathe advised the pt that he should not leave and that he needs an MRI of his brain done in order to rule out a stroke. Pt sts he would like to leave and come back later tonight after he has moved his truck. Pt on the phone with wife at this time to ask her to pick him up.

## 2016-10-07 NOTE — ED Provider Notes (Signed)
[facility] DEPT Provider Note   CSN: [id number] Arrival date & time: 10/07/16  1836     History   Chief Complaint Chief Complaint  Patient presents with  . Stroke Symptoms    HPI Erroll Wilbourne is a 51 y.o. male.  The history is provided by the patient and the EMS personnel. No language interpreter was used.   Hershell Brandl is a 51 y.o. male who presents to the Emergency Department complaining of AMS.  He presents via EMS from his [place]. He is on his ninth day sober when he was at his [place] meeting and developed nausea, lightheadedness and stuttering speech. Symptoms were described as similar to his prior CVA. He states he currently feels completely back to his baseline and wonders if this could've been a panic attack. He denies any chest pain, shortness of breath, diaphoresis, numbness, weakness. He states that he takes a medication that was prescribed from his doctor to help him with his alcohol withdrawal. He is not sure what the medication is. Past Medical History:  Diagnosis Date  . ADD (attention deficit disorder)   . Anxiety   . Back pain   . Bipolar depression ([place])   . CHF (congestive heart failure) ([place])   . Congenital heart disease   . Congenital heart disease    ? self reported valve issue  . CVA (cerebral vascular accident) ([place])    embolic stroke 09/3823  . Depression   . Essential hypertension   . GERD (gastroesophageal reflux disease)   . History of ETOH abuse   . Substance abuse     Patient Active Problem List   Diagnosis Date Noted  . History of CHF (congestive heart failure) 06/09/2015  . History of stroke 06/09/2015  . Special screening for malignant neoplasms, colon 06/09/2015  . Screening for prostate cancer 06/09/2015  . Gastroesophageal reflux disease without esophagitis 06/09/2015  . Chronic low back pain 06/09/2015  . Paresthesia 06/09/2015  . Vaccine refused by patient 06/09/2015  . History of substance abuse 06/09/2015  . Smoker  06/09/2015  . Chest discomfort 06/09/2015  . Encounter for health maintenance examination in adult 06/09/2015  . Depression 06/09/2015  . HLD (hyperlipidemia)   . Essential hypertension   . Tobacco use disorder   . Mild tetrahydrocannabinol (THC) abuse   . Morbid obesity ([place])   . Embolic stroke ([place]) 05/39/7673  . CVA (cerebral infarction) 10/29/2014  . Umbilical hernia 41/93/7902    Past Surgical History:  Procedure Laterality Date  . BACK SURGERY Bilateral 1996  . CARDIAC CATHETERIZATION     at age 11  . CARPAL TUNNEL RELEASE    . INSERTION OF MESH N/A 05/08/2015   Procedure: INSERTION OF MESH;  Surgeon: Erroll Luna, MD;  Location: [facility];  Service: General;  Laterality: N/A;  . LIPOMA EXCISION Right 05/06/2016   Procedure: EXCISION LIPOMA RIGHT THIGH POSTERIOR;  Surgeon: Erroll Luna, MD;  Location: [facility];  Service: General;  Laterality: Right;  . LOOP RECORDER IMPLANT N/A 11/01/2014   Procedure: LOOP RECORDER IMPLANT;  Surgeon: Deboraha Sprang, MD;  Location: [facility] CATH LAB;  Service: Cardiovascular;  Laterality: N/A;  . SKIN GRAFT    . TEE WITHOUT CARDIOVERSION N/A 11/01/2014   Procedure: TRANSESOPHAGEAL ECHOCARDIOGRAM (TEE);  Surgeon: Josue Hector, MD;  Location: [facility];  Service: Cardiovascular;  Laterality: N/A;  . UMBILICAL HERNIA REPAIR N/A 05/08/2015   Procedure: UMBILICAL HERNIA REPAIR WITH MESH;  Surgeon: Erroll Luna, MD;  Location: [facility];  Service: General;  Laterality: N/A;       Home Medications    Prior to Admission medications   Medication Sig Start Date End Date Taking? Authorizing Provider  atorvastatin (LIPITOR) 40 MG tablet Take 1 tablet (40 mg total) by mouth daily at 6 PM. 06/11/15  Yes Camelia Eng Tysinger, PA-C  diazepam (VALIUM) 10 MG tablet Take 10 mg by mouth at bedtime. 09/25/16  Yes Historical Provider, MD  divalproex (DEPAKOTE ER) 500 MG 24 hr tablet Take 1,000 mg by mouth 2 (two)  times daily.    Yes Historical Provider, MD  fenofibrate (TRICOR) 145 MG tablet TAKE 1 TABLET BY MOUTH EVERY DAY 05/03/16  Yes Camelia Eng Tysinger, PA-C  naltrexone (DEPADE) 50 MG tablet Take 50 mg by mouth at bedtime. 09/28/16  Yes Historical Provider, MD  pantoprazole (PROTONIX) 40 MG tablet TAKE 1 TABLET BY MOUTH EVERY DAY 05/03/16  Yes Camelia Eng Tysinger, PA-C  aspirin EC 81 MG tablet Take 1 tablet (81 mg total) by mouth daily. Patient not taking: Reported on 10/07/2016 06/11/15   Camelia Eng Tysinger, PA-C  doxycycline (VIBRAMYCIN) 50 MG capsule Take 2 capsules (100 mg total) by mouth 2 (two) times daily. Patient not taking: Reported on 10/07/2016 05/06/16   Erroll Luna, MD  ondansetron (ZOFRAN) 4 MG tablet Take 1 tablet (4 mg total) by mouth every 8 (eight) hours as needed for nausea or vomiting. Patient not taking: Reported on 10/07/2016 05/06/16   Erroll Luna, MD  oxyCODONE-acetaminophen (ROXICET) 5-325 MG tablet Take 1 tablet by mouth every 4 (four) hours as needed. Patient not taking: Reported on 10/07/2016 05/06/16   Erroll Luna, MD    Family History Family History  Problem Relation Age of Onset  . Arthritis Mother   . Hearing loss Mother   . Heart block Father   . Cancer Maternal Grandmother   . Cancer Paternal Grandfather     lymphoma    Social History Social History  Substance Use Topics  . Smoking status: Current Some Day Smoker    Packs/day: 0.50    Types: Cigarettes  . Smokeless tobacco: Never Used     Comment: Says he smokes more when he is drinking.  . Alcohol use No     Comment: Drinks 18- 24 two to three times a week. Pt 9 days sober from 10/07/16     Allergies   Codeine   Review of Systems Review of Systems  All other systems reviewed and are negative.    Physical Exam Updated Vital Signs BP (!) 119/56   Pulse 80   Temp 98.4 F (36.9 C) (Oral)   Resp 18   Ht 5\' 6"  (1.676 m)   Wt 222 lb (100.7 kg)   SpO2 98%   BMI 35.83 kg/m   Physical Exam    Constitutional: He is oriented to person, place, and time. He appears well-developed and well-nourished.  HENT:  Head: Normocephalic and atraumatic.  Mouth/Throat: Oropharynx is clear and moist.  Eyes: EOM are normal. Pupils are equal, round, and reactive to light.  Cardiovascular: Normal rate and regular rhythm.   No murmur heard. Pulmonary/Chest: Effort normal and breath sounds normal. No respiratory distress.  Abdominal: Soft. There is no tenderness. There is no rebound and no guarding.  Musculoskeletal: He exhibits no edema or tenderness.  Neurological: He is alert and oriented to person, place, and time. No cranial nerve deficit or sensory deficit. Coordination normal.  5 out of 5  strength in all 4 extremities with sensation to light touch intact in all 4 extremities. No pronator drift.  Skin: Skin is warm and dry.  Psychiatric: He has a normal mood and affect. His behavior is normal.  Nursing note and vitals reviewed.    ED Treatments / Results  Labs (all labs ordered are listed, but only abnormal results are displayed) Labs Reviewed  COMPREHENSIVE METABOLIC PANEL - Abnormal; Notable for the following:       Result Value   Alkaline Phosphatase 34 (*)    All other components within normal limits  RAPID URINE DRUG SCREEN, HOSP PERFORMED - Abnormal; Notable for the following:    Benzodiazepines POSITIVE (*)    All other components within normal limits  URINALYSIS, ROUTINE W REFLEX MICROSCOPIC - Abnormal; Notable for the following:    Color, Urine STRAW (*)    Specific Gravity, Urine 1.004 (*)    All other components within normal limits  ETHANOL  CBC  DIFFERENTIAL  I-STAT TROPOININ, ED    EKG  EKG Interpretation None       Radiology Ct Head Wo Contrast  Result Date: 10/07/2016 CLINICAL DATA:  Change in mental status EXAM: CT HEAD WITHOUT CONTRAST TECHNIQUE: Contiguous axial images were obtained from the base of the skull through the vertex without intravenous  contrast. COMPARISON:  01/13/2016, 10/30/2014 FINDINGS: Brain: No acute territorial infarction, intracranial hemorrhage, or extra-axial fluid collection is seen. No focal mass, mass effect or midline shift. Questionable low-attenuation focus within left frontal subcortical white matter, series 201, image number 19. Mild atrophy. Stable ventricle size Vascular: No hyperdense vessels.  No unexpected calcifications Skull: Minimal fluid in the inferior mastoids. No skull fracture. No suspicious lesion Sinuses/Orbits: Mild mucosal thickening in the ethmoid sinuses. No acute orbital abnormality. Other: None IMPRESSION: 1. Negative for acute hemorrhage or large territorial infarct 2. Possible subtle low-attenuation focus in the left frontal lobe white matter ; if CVA is suspected, correlation with MRI could be obtained. Electronically Signed   By: Donavan Foil M.D.   On: 10/07/2016 19:55   Mr Brain Wo Contrast  Result Date: 10/07/2016 CLINICAL DATA:  51 y/o  M; speech difficulties and dizziness. EXAM: MRI HEAD WITHOUT CONTRAST TECHNIQUE: Multiplanar, multiecho pulse sequences of the brain and surrounding structures were obtained without intravenous contrast. COMPARISON:  10/07/2016 CT of the head. FINDINGS: Brain: No acute infarction, hemorrhage, hydrocephalus, extra-axial collection or mass lesion. Few scattered foci of T2 FLAIR hyperintense signal abnormality are present in subcortical and periventricular white matter compatible with minimal chronic microvascular ischemic changes. Vascular: Normal flow voids. Skull and upper cervical spine: Normal marrow signal. Sinuses/Orbits: Mild diffuse paranasal sinus mucosal thickening. No abnormal signal of mastoid air cells. Orbits are unremarkable. Other: None. IMPRESSION: 1. No acute intracranial abnormality identified. 2. Minimal chronic microvascular ischemic changes of the brain. 3. Mild paranasal sinus disease. Electronically Signed   By: Kristine Garbe M.D.    On: 10/07/2016 23:27    Procedures Procedures (including critical care time)  Medications Ordered in ED Medications  LORazepam (ATIVAN) injection 1 mg (1 mg Intravenous Given 10/07/16 2212)  LORazepam (ATIVAN) injection 1 mg (1 mg Intravenous Given 10/07/16 2227)     Initial Impression / Assessment and Plan / ED Course  I have reviewed the triage vital signs and the nursing notes.  Pertinent labs & imaging results that were available during my care of the patient were reviewed by me and considered in my medical decision making (see chart for details).  Patient with history of CVA and alcohol abuse here for evaluation of episode of nausea and stuttering speech with lightheadedness. He has a normal neurologic examination in the emergency department with no deficits. Patient states that this is similar to his prior CVA and CT head was obtained. CT head with region concerning for possible stroke and MRI was ordered. Initially patient was not able to stay for MRI but was able to change his plans and make arrangements to stay for further testing. MRI with no acute abnormality. Patient continues to be asymptomatic in the emergency department. On repeat assessment he states that he  also had similar episodes in the past when he has had panic attacks. Discussed with patient outpatient follow-up for nausea, questionable panic attack. Discussed PCP follow-up and return to percussion  Final Clinical Impressions(s) / ED Diagnoses   Final diagnoses:  Nausea  Dizziness    New Prescriptions New Prescriptions   No medications on file     Quintella Reichert, MD 10/07/16 2338

## 2016-10-07 NOTE — ED Notes (Signed)
ED Provider at bedside. 

## 2016-10-07 NOTE — ED Notes (Signed)
Pt ambulatory to the restroom.  

## 2016-10-07 NOTE — ED Notes (Signed)
Pt transported to MRI 

## 2016-10-22 ENCOUNTER — Ambulatory Visit (INDEPENDENT_AMBULATORY_CARE_PROVIDER_SITE_OTHER): Payer: Managed Care, Other (non HMO) | Admitting: *Deleted

## 2016-10-22 DIAGNOSIS — I639 Cerebral infarction, unspecified: Secondary | ICD-10-CM

## 2016-10-22 NOTE — Progress Notes (Signed)
Carelink Summary Report / Loop Recorder 

## 2016-10-28 LAB — CUP PACEART REMOTE DEVICE CHECK
Date Time Interrogation Session: 20180420060552
Implantable Pulse Generator Implant Date: 20160429

## 2016-11-22 ENCOUNTER — Ambulatory Visit (INDEPENDENT_AMBULATORY_CARE_PROVIDER_SITE_OTHER): Payer: Managed Care, Other (non HMO) | Admitting: *Deleted

## 2016-11-22 DIAGNOSIS — I639 Cerebral infarction, unspecified: Secondary | ICD-10-CM

## 2016-11-22 NOTE — Progress Notes (Signed)
Carelink Summary Report / Loop Recorder 

## 2016-11-25 LAB — CUP PACEART REMOTE DEVICE CHECK
Implantable Pulse Generator Implant Date: 20160429
MDC IDC SESS DTM: 20180520061053

## 2016-12-21 ENCOUNTER — Ambulatory Visit (INDEPENDENT_AMBULATORY_CARE_PROVIDER_SITE_OTHER): Payer: Managed Care, Other (non HMO) | Admitting: *Deleted

## 2016-12-21 DIAGNOSIS — I639 Cerebral infarction, unspecified: Secondary | ICD-10-CM

## 2016-12-21 NOTE — Progress Notes (Signed)
Carelink Summary Report / Loop Recorder 

## 2016-12-29 LAB — CUP PACEART REMOTE DEVICE CHECK
Implantable Pulse Generator Implant Date: 20160429
MDC IDC SESS DTM: 20180619061339

## 2017-01-20 ENCOUNTER — Ambulatory Visit (INDEPENDENT_AMBULATORY_CARE_PROVIDER_SITE_OTHER): Payer: Self-pay | Admitting: *Deleted

## 2017-01-20 DIAGNOSIS — I639 Cerebral infarction, unspecified: Secondary | ICD-10-CM

## 2017-01-25 NOTE — Progress Notes (Signed)
Carelink Summary Report / Loop Recorder 

## 2017-02-04 LAB — CUP PACEART REMOTE DEVICE CHECK
Date Time Interrogation Session: 20180719223924
Implantable Pulse Generator Implant Date: 20160429

## 2017-02-21 ENCOUNTER — Ambulatory Visit (INDEPENDENT_AMBULATORY_CARE_PROVIDER_SITE_OTHER): Payer: Self-pay | Admitting: *Deleted

## 2017-02-21 DIAGNOSIS — I639 Cerebral infarction, unspecified: Secondary | ICD-10-CM

## 2017-02-22 NOTE — Progress Notes (Signed)
Carelink Summary Report / Loop Recorder 

## 2017-02-25 LAB — CUP PACEART REMOTE DEVICE CHECK
MDC IDC PG IMPLANT DT: 20160429
MDC IDC SESS DTM: 20180818224337

## 2017-03-15 ENCOUNTER — Other Ambulatory Visit: Payer: Self-pay | Admitting: Medical

## 2017-03-21 ENCOUNTER — Ambulatory Visit (INDEPENDENT_AMBULATORY_CARE_PROVIDER_SITE_OTHER): Payer: Self-pay | Admitting: *Deleted

## 2017-03-21 DIAGNOSIS — I639 Cerebral infarction, unspecified: Secondary | ICD-10-CM

## 2017-03-22 NOTE — Progress Notes (Signed)
Carelink Summary Report / Loop Recorder 

## 2017-03-24 LAB — CUP PACEART REMOTE DEVICE CHECK
Date Time Interrogation Session: 20180917233922
Implantable Pulse Generator Implant Date: 20160429

## 2017-04-12 ENCOUNTER — Other Ambulatory Visit: Payer: Self-pay | Admitting: Medical

## 2017-04-12 ENCOUNTER — Encounter: Payer: Self-pay | Admitting: Medical

## 2017-04-20 ENCOUNTER — Ambulatory Visit (INDEPENDENT_AMBULATORY_CARE_PROVIDER_SITE_OTHER): Payer: 59 | Admitting: *Deleted

## 2017-04-20 DIAGNOSIS — I639 Cerebral infarction, unspecified: Secondary | ICD-10-CM | POA: Diagnosis not present

## 2017-04-21 NOTE — Progress Notes (Signed)
Carelink Summary Report / Loop Recorder 

## 2017-04-22 LAB — CUP PACEART REMOTE DEVICE CHECK
Date Time Interrogation Session: 20181017234032
MDC IDC PG IMPLANT DT: 20160429

## 2017-05-12 ENCOUNTER — Ambulatory Visit (INDEPENDENT_AMBULATORY_CARE_PROVIDER_SITE_OTHER): Payer: BLUE CROSS/BLUE SHIELD | Admitting: Medical

## 2017-05-12 ENCOUNTER — Encounter: Payer: Self-pay | Admitting: Medical

## 2017-05-12 VITALS — BP 110/78 | HR 66 | Ht 65.5 in | Wt 228.0 lb

## 2017-05-12 DIAGNOSIS — Z2820 Immunization not carried out because of patient decision for unspecified reason: Secondary | ICD-10-CM

## 2017-05-12 DIAGNOSIS — F17200 Nicotine dependence, unspecified, uncomplicated: Secondary | ICD-10-CM | POA: Diagnosis not present

## 2017-05-12 DIAGNOSIS — Z8679 Personal history of other diseases of the circulatory system: Secondary | ICD-10-CM

## 2017-05-12 DIAGNOSIS — I10 Essential (primary) hypertension: Secondary | ICD-10-CM | POA: Diagnosis not present

## 2017-05-12 DIAGNOSIS — K219 Gastro-esophageal reflux disease without esophagitis: Secondary | ICD-10-CM | POA: Diagnosis not present

## 2017-05-12 DIAGNOSIS — M545 Low back pain: Secondary | ICD-10-CM | POA: Diagnosis not present

## 2017-05-12 DIAGNOSIS — Z0000 Encounter for general adult medical examination without abnormal findings: Secondary | ICD-10-CM | POA: Diagnosis not present

## 2017-05-12 DIAGNOSIS — F1911 Other psychoactive substance abuse, in remission: Secondary | ICD-10-CM

## 2017-05-12 DIAGNOSIS — Z87898 Personal history of other specified conditions: Secondary | ICD-10-CM | POA: Diagnosis not present

## 2017-05-12 DIAGNOSIS — E785 Hyperlipidemia, unspecified: Secondary | ICD-10-CM

## 2017-05-12 DIAGNOSIS — Z8673 Personal history of transient ischemic attack (TIA), and cerebral infarction without residual deficits: Secondary | ICD-10-CM

## 2017-05-12 DIAGNOSIS — R413 Other amnesia: Secondary | ICD-10-CM | POA: Insufficient documentation

## 2017-05-12 DIAGNOSIS — Z1211 Encounter for screening for malignant neoplasm of colon: Secondary | ICD-10-CM

## 2017-05-12 DIAGNOSIS — Z125 Encounter for screening for malignant neoplasm of prostate: Secondary | ICD-10-CM

## 2017-05-12 DIAGNOSIS — G8929 Other chronic pain: Secondary | ICD-10-CM

## 2017-05-12 DIAGNOSIS — R0600 Dyspnea, unspecified: Secondary | ICD-10-CM | POA: Insufficient documentation

## 2017-05-12 DIAGNOSIS — Z1159 Encounter for screening for other viral diseases: Secondary | ICD-10-CM

## 2017-05-12 LAB — POCT URINALYSIS DIP (PROADVANTAGE DEVICE)
BILIRUBIN UA: NEGATIVE
BILIRUBIN UA: NEGATIVE mg/dL
GLUCOSE UA: NEGATIVE mg/dL
Leukocytes, UA: NEGATIVE
NITRITE UA: NEGATIVE
PH UA: 7 (ref 5.0–8.0)
Protein Ur, POC: NEGATIVE mg/dL
RBC UA: NEGATIVE
Specific Gravity, Urine: 1.015
Urobilinogen, Ur: NEGATIVE

## 2017-05-12 NOTE — Patient Instructions (Addendum)
Thank you for giving me the opportunity to serve you today.   Your diagnoses today includes: Encounter Diagnoses  Name Primary?  . Encounter for health maintenance examination in adult Yes  . Essential hypertension   . Gastroesophageal reflux disease without esophagitis   . Chronic low back pain, unspecified back pain laterality, with sciatica presence unspecified   . History of CHF (congestive heart failure)   . History of stroke   . History of substance abuse   . Smoker   . Morbid obesity ([place])   . Hyperlipidemia, unspecified hyperlipidemia type   . Vaccine refused by patient   . Special screening for malignant neoplasms, colon   . Screening for prostate cancer   . Memory loss   . Dyspnea, unspecified type   . Encounter for hepatitis C screening test for low risk patient     Return yearly for a physical.  I last saw you for physical 2016  We will try and get a copy of your last colonoscopy.  I don't have any evidence it was done, but you reported it was done 2 years ago.   Specific recommendations today include Exercise most days per week, preferably at least 30 minutes every day Consider seated hand bike at the gym, water aerobics or recumbent bike at the gym Eat a healthy low fat diet STOP SMOKING! See your eye doctor yearly for routine vision care. See your dentist yearly for routine dental care including hygiene visits twice yearly.   Vaccine recommendations: I recommend you have a shingles vaccine to help prevent shingles or herpes zoster outbreak.   Please call your insurer to inquire about coverage for the Shingrix vaccine given in 2 doses.   Some insurers cover this vaccine after age 84, some cover this after age 47.  If your insurer covers this, then call to schedule appointment to have this vaccine here. Also call about coverage for pneumococcal vaccine   I have included other useful information below for your review.  Medication costs:  If you get to the  pharmacy and medication prescribed today was either too expensive, not covered by your insurance, or required prior authorization, then please call us back to let us know.  We often have no way to know if a medication is too expensive or not covered by your insurance.  Thanks for your cooperation.   Preventative Care for Adults, Male       REGULAR HEALTH EXAMS:  A routine yearly physical is a good way to check in with your primary care provider about your health and preventive screening. It is also an opportunity to share updates about your health and any concerns you have, and receive a thorough all-over exam.   Most health insurance companies pay for at least some preventative services.  Check with your health plan for specific coverages.  WHAT PREVENTATIVE SERVICES DO MEN NEED?  Adult men should have their weight and blood pressure checked regularly.   Men age 46 and older should have their cholesterol levels checked regularly.  Beginning at age 61 and continuing to age 22, men should be screened for colorectal cancer.  Certain people should may need continued testing until age 74.  Other cancer screening may include exams for testicular and prostate cancer.  Updating vaccinations is part of preventative care.  Vaccinations help protect against diseases such as the flu.  Lab tests are generally done as part of preventative care to screen for anemia and blood disorders, to screen for  problems with the kidneys and liver, to screen for bladder problems, to check blood sugar, and to check your cholesterol level.  Preventative services generally include counseling about diet, exercise, avoiding tobacco, drugs, excessive alcohol consumption, and sexually transmitted infections.    GENERAL RECOMMENDATIONS FOR GOOD HEALTH:  Healthy diet:  Eat a variety of foods, including fruit, vegetables, animal or vegetable protein, such as meat, fish, chicken, and eggs, or beans, lentils, tofu, and  grains, such as rice.  Drink plenty of water daily.  Decrease saturated fat in the diet, avoid lots of red meat, processed foods, sweets, fast foods, and fried foods.  Exercise:  Aerobic exercise helps maintain good heart health. At least 30-40 minutes of moderate-intensity exercise is recommended. For example, a brisk walk that increases your heart rate and breathing. This should be done on most days of the week.   Find a type of exercise or a variety of exercises that you enjoy so that it becomes a part of your daily life.  Examples are running, walking, swimming, water aerobics, and biking.  For motivation and support, explore group exercise such as aerobic class, spin class, Zumba, Yoga,or  martial arts, etc.    Set exercise goals for yourself, such as a certain weight goal, walk or run in a race such as a 5k walk/run.  Speak to your primary care provider about exercise goals.  Disease prevention:  If you smoke or chew tobacco, find out from your caregiver how to quit. It can literally save your life, no matter how long you have been a tobacco user. If you do not use tobacco, never begin.   Maintain a healthy diet and normal weight. Increased weight leads to problems with blood pressure and diabetes.   The Body Mass Index or BMI is a way of measuring how much of your body is fat. Having a BMI above 27 increases the risk of heart disease, diabetes, hypertension, stroke and other problems related to obesity. Your caregiver can help determine your BMI and based on it develop an exercise and dietary program to help you achieve or maintain this important measurement at a healthful level.  High blood pressure causes heart and blood vessel problems.  Persistent high blood pressure should be treated with medicine if weight loss and exercise do not work.   Fat and cholesterol leaves deposits in your arteries that can block them. This causes heart disease and vessel disease elsewhere in your body.   If your cholesterol is found to be high, or if you have heart disease or certain other medical conditions, then you may need to have your cholesterol monitored frequently and be treated with medication.   Ask if you should have a stress test if your history suggests this. A stress test is a test done on a treadmill that looks for heart disease. This test can find disease prior to there being a problem.  Avoid drinking alcohol in excess (more than two drinks per day).  Avoid use of street drugs. Do not share needles with anyone. Ask for professional help if you need assistance or instructions on stopping the use of alcohol, cigarettes, and/or drugs.  Brush your teeth twice a day with fluoride toothpaste, and floss once a day. Good oral hygiene prevents tooth decay and gum disease. The problems can be painful, unattractive, and can cause other health problems. Visit your dentist for a routine oral and dental check up and preventive care every 6-12 months.   Look at your  skin regularly.  Use a mirror to look at your back. Notify your caregivers of changes in moles, especially if there are changes in shapes, colors, a size larger than a pencil eraser, an irregular border, or development of new moles.  Safety:  Use seatbelts 100% of the time, whether driving or as a passenger.  Use safety devices such as hearing protection if you work in environments with loud noise or significant background noise.  Use safety glasses when doing any work that could send debris in to the eyes.  Use a helmet if you ride a bike or motorcycle.  Use appropriate safety gear for contact sports.  Talk to your caregiver about gun safety.  Use sunscreen with a SPF (or skin protection factor) of 15 or greater.  Lighter skinned people are at a greater risk of skin cancer. Don't forget to also wear sunglasses in order to protect your eyes from too much damaging sunlight. Damaging sunlight can accelerate cataract formation.   Practice  safe sex. Use condoms. Condoms are used for birth control and to help reduce the spread of sexually transmitted infections (or STIs).  Some of the STIs are gonorrhea (the clap), chlamydia, syphilis, trichomonas, herpes, HPV (human papilloma virus) and HIV (human immunodeficiency virus) which causes AIDS. The herpes, HIV and HPV are viral illnesses that have no cure. These can result in disability, cancer and death.   Keep carbon monoxide and smoke detectors in your home functioning at all times. Change the batteries every 6 months or use a model that plugs into the wall.   Vaccinations:  Stay up to date with your tetanus shots and other required immunizations. You should have a booster for tetanus every 10 years. Be sure to get your flu shot every year, since 5%-20% of the [place] population comes down with the flu. The flu vaccine changes each year, so being vaccinated once is not enough. Get your shot in the fall, before the flu season peaks.   Other vaccines to consider:  Pneumococcal vaccine to protect against certain types of pneumonia.  This is normally recommended for adults age 84 or older.  However, adults younger than 53 years old with certain underlying conditions such as diabetes, heart or lung disease should also receive the vaccine.  Shingles vaccine to protect against Varicella Zoster if you are older than age 55, or younger than 53 years old with certain underlying illness.  Hepatitis A vaccine to protect against a form of infection of the liver by a virus acquired from food.  Hepatitis B vaccine to protect against a form of infection of the liver by a virus acquired from blood or body fluids, particularly if you work in health care.  If you plan to travel internationally, check with your local health department for specific vaccination recommendations.  Cancer Screening:  Most routine colon cancer screening begins at the age of 91. On a yearly basis, doctors may provide special  easy to use take-home tests to check for hidden blood in the stool. Sigmoidoscopy or colonoscopy can detect the earliest forms of colon cancer and is life saving. These tests use a small camera at the end of a tube to directly examine the colon. Speak to your caregiver about this at age 33, when routine screening begins (and is repeated every 5 years unless early forms of pre-cancerous polyps or small growths are found).   At the age of 21 men usually start screening for prostate cancer every year. Screening may begin  at a younger age for those with higher risk. Those at higher risk include African-Americans or having a family history of prostate cancer. There are two types of tests for prostate cancer:   Prostate-specific antigen (PSA) testing. Recent studies raise questions about prostate cancer using PSA and you should discuss this with your caregiver.   Digital rectal exam (in which your doctor's lubricated and gloved finger feels for enlargement of the prostate through the anus).   Screening for testicular cancer.  Do a monthly exam of your testicles. Gently roll each testicle between your thumb and fingers, feeling for any abnormal lumps. The best time to do this is after a hot shower or bath when the tissues are looser. Notify your caregivers of any lumps, tenderness or changes in size or shape immediately.

## 2017-05-12 NOTE — Progress Notes (Signed)
Subjective: HPI  [name] is a 51 y.o. male who presents for Chief Complaint  Patient presents with  . Annual Exam    physical , no other conerns    Last physical and routine care 2016 here!  Medical care team includes: Tysinger, Leward Quan here for primary care Dentist Eye doctor Dr. Marcial Pacas, neurology Prior studies with Dr. Johnsie Cancel, cardiology Dr. Melina Schools, [facility] ortho Dr. Chucky May, psychiatry Dr. Coralie Keens, general surgery  Concerns: He notes early dementia from strokes.     Lives at home with wife.  Disabled, hasn't worked since 2008.   Has not been able to get disability. Stopped drinking after strokes, has cut way back on cigarettes.  Used to smoke and drink together in the past.  Smoking 1 pack every 4-5 days.  Feels fine otherwise.  denies current substance abuse.  Reviewed their medical, surgical, family, social, medication, and allergy history and updated chart as appropriate.  Past Medical History:  Diagnosis Date  . ADD (attention deficit disorder)   . Anxiety   . Back pain   . Bipolar depression ([place])   . CHF (congestive heart failure) ([place])   . Congenital heart disease   . Congenital heart disease    ? self reported valve issue  . CVA (cerebral vascular accident) ([place])    embolic stroke 11/5730  . Depression   . Essential hypertension   . GERD (gastroesophageal reflux disease)   . Hyperlipidemia   . Substance abuse ([place])    hx/o alcohol, marijanua abuse    Past Surgical History:  Procedure Laterality Date  . BACK SURGERY Bilateral 1996  . CARDIAC CATHETERIZATION     at age 48  . CARPAL TUNNEL RELEASE     bilat  . SKIN GRAFT      Social History   Socioeconomic History  . Marital status: Married    Spouse name: Not on file  . Number of children: 2  . Years of education: 8  . Highest education level: Not on file  Social Needs  . Financial resource strain: Not on file  . Food insecurity - worry: Not on file   . Food insecurity - inability: Not on file  . Transportation needs - medical: Not on file  . Transportation needs - non-medical: Not on file  Occupational History  . Occupation: Unemployed  Tobacco Use  . Smoking status: Current Some Day Smoker    Packs/day: 0.25    Types: Cigarettes  . Smokeless tobacco: Never Used  . Tobacco comment: Says he smokes more when he is drinking.  Substance and Sexual Activity  . Alcohol use: No    Alcohol/week: 0.0 oz  . Drug use: No  . Sexual activity: Not on file  Other Topics Concern  . Not on file  Social History Narrative   Lives at home with wife.   Right-handed.   Former Lobbyist   Unemployed, unable to get on disability as of 2018    Family History  Problem Relation Age of Onset  . Arthritis Mother   . Hearing loss Mother   . Heart block Father   . Cancer Maternal Grandmother   . Cancer Paternal Grandfather        lymphoma     Current Outpatient Medications:  .  atorvastatin (LIPITOR) 40 MG tablet, TAKE 1 TABLET (40 MG TOTAL) BY MOUTH DAILY AT 6 PM., Disp: 30 tablet, Rfl: 1 .  diazepam (VALIUM) 10 MG tablet,  Take 10 mg by mouth at bedtime., Disp: , Rfl:  .  divalproex (DEPAKOTE ER) 500 MG 24 hr tablet, Take 1,000 mg by mouth 2 (two) times daily. , Disp: , Rfl:  .  fenofibrate (TRICOR) 145 MG tablet, TAKE 1 TABLET BY MOUTH EVERY DAY, Disp: 90 tablet, Rfl: 0  Allergies  Allergen Reactions  . Codeine Nausea And Vomiting    Review of Systems Constitutional: -fever, -chills, -sweats, -unexpected weight change, -decreased appetite, -fatigue Allergy: -sneezing, -itching, -congestion Dermatology: -changing moles, --rash, -lumps ENT: -runny nose, -ear pain, -sore throat, -hoarseness, -sinus pain, -teeth pain, - ringing in ears, -hearing loss, -nosebleeds Cardiology: -chest pain, -palpitations, -swelling, -difficulty breathing when lying flat, -waking up short of breath Respiratory: -cough, -shortness of breath,  -difficulty breathing with exercise or exertion, -wheezing, -coughing up blood Gastroenterology: -abdominal pain, -nausea, -vomiting, -diarrhea, -constipation, -blood in stool, -changes in bowel movement, -difficulty swallowing or eating Hematology: -bleeding, -bruising  Musculoskeletal: -joint aches, -muscle aches, -joint swelling, -back pain, -neck pain, -cramping, -changes in gait Ophthalmology: denies vision changes, eye redness, itching, discharge Urology: -burning with urination, -difficulty urinating, -blood in urine, -urinary frequency, -urgency, -incontinence Neurology: -headache, -weakness, -tingling, -numbness, -memory loss, -falls, -dizziness Psychology: -depressed mood, -agitation, -sleep problems     Objective:   BP 110/78   Pulse 66   Ht 5' 5.5" (1.664 m)   Wt 228 lb (103.4 kg)   SpO2 96%   BMI 37.36 kg/m   Wt Readings from Last 3 Encounters:  05/12/17 228 lb (103.4 kg)  10/07/16 222 lb (100.7 kg)  07/14/16 236 lb (107 kg)   BP Readings from Last 3 Encounters:  05/12/17 110/78  10/07/16 (!) 122/98  08/23/16 147/87    General appearance: alert, no distress, WD/WN, Caucasian male Skin: scattered macules and small cherry hemangiomas of torso, mainly abdomen and chest.  No worrisome lesions HEENT: normocephalic, conjunctiva/corneas normal, sclerae anicteric, PERRLA, EOMi, nares patent, no discharge or erythema, pharynx normal Oral cavity: MMM, tongue normal, teeth upper denture, lower WNL Neck: supple, no lymphadenopathy, no thyromegaly, no masses, normal ROM, no bruits Chest: left upper chest with loop recorder under chest wall, non tender, normal shape and expansion Heart: RRR, normal S1, S2, no murmurs Lungs: CTA bilaterally, no wheezes, rhonchi, or rales Abdomen: +bs, soft, non tender, non distended, no masses, no hepatomegaly, no splenomegaly, no bruits Back: decreased ROM due to pain, tender mildly in lumbar spin, otherwise non tender, no  scoliosis Musculoskeletal: upper extremities non tender, no obvious deformity, normal ROM throughout, lower extremities non tender, no obvious deformity, normal ROM throughout Extremities: no edema, no cyanosis, no clubbing Pulses: 2+ symmetric, upper and lower extremities, normal cap refill Neurological: alert, oriented x 3, CN2-12 intact, strength normal upper extremities and lower extremities, sensation normal throughout, DTRs 2+ throughout, no cerebellar signs, gait normal Psychiatric: normal affect, behavior normal, pleasant  GU: normal male external genitalia,circumcised, nontender, no masses, no hernia, no lymphadenopathy Rectal: declined   Adult ECG Report  Indication: HTN, hx/o CHF, hx/o Stroke  Rate: 66 bpm  Rhythm: normal sinus rhythm  QRS Axis: -14 degrees  PR Interval: 143ms  QRS Duration: 39ms  QTc: 412ms  Conduction Disturbances: none  Other Abnormalities: none  Patient's cardiac risk factors are: dyslipidemia, hypertension, male gender, obesity (BMI >= 30 kg/m2) and smoking/ tobacco exposure.  EKG comparison: 2016  Narrative Interpretation: no acute changes      Assessment and Plan :    Encounter Diagnoses  Name Primary?  . Encounter  for health maintenance examination in adult Yes  . Essential hypertension   . Gastroesophageal reflux disease without esophagitis   . Chronic low back pain, unspecified back pain laterality, with sciatica presence unspecified   . History of CHF (congestive heart failure)   . History of stroke   . History of substance abuse   . Smoker   . Morbid obesity ([place])   . Hyperlipidemia, unspecified hyperlipidemia type   . Vaccine refused by patient   . Special screening for malignant neoplasms, colon   . Screening for prostate cancer   . Memory loss   . Dyspnea, unspecified type   . Encounter for hepatitis C screening test for low risk patient     Physical exam - discussed and counseled on healthy lifestyle, diet, exercise,  preventative care, vaccinations, sick and well care, proper use of emergency dept and after hours care, and addressed their concerns.    Health screening: See your eye doctor yearly for routine vision care. See your dentist yearly for routine dental care including hygiene visits twice yearly.  Cancer screening Discussed colonoscopy screening, prostate cancern screen.  He reports getting EGD and colonoscopy although I can't find evidence of this.   We will call local GI offices to try and hunt this down.  Vaccinations: He refuses flu vaccine. He reports Td updated 2-3 years ago at another office He will consider pneumococcal and shingles vaccines.  He has not been good about routine care, f/u, compliance.     He states sobriety for a while now, at least last 6+months.   I encouraged exercise, healthy diet, compliance with medications and f/u.   Labs today.  Talon was seen today for annual exam.  Diagnoses and all orders for this visit:  Encounter for health maintenance examination in adult -     Hepatic function panel -     Basic metabolic panel -     CBC with Differential/Platelet -     Lipid panel -     TSH -     PSA -     Hemoglobin A1c -     Microalbumin / creatinine urine ratio -     RPR -     HIV antibody -     Hepatitis C antibody -     EKG 12-Lead  Essential hypertension -     EKG 12-Lead  Gastroesophageal reflux disease without esophagitis  Chronic low back pain, unspecified back pain laterality, with sciatica presence unspecified  History of CHF (congestive heart failure) -     EKG 12-Lead  History of stroke -     EKG 12-Lead  History of substance abuse -     Hepatic function panel -     Basic metabolic panel -     CBC with Differential/Platelet -     RPR -     HIV antibody -     Hepatitis C antibody  Smoker  Morbid obesity (HCC) -     Hemoglobin A1c  Hyperlipidemia, unspecified hyperlipidemia type -     Lipid panel  Vaccine refused by  patient  Special screening for malignant neoplasms, colon  Screening for prostate cancer -     PSA  Memory loss -     Hepatic function panel -     Basic metabolic panel -     CBC with Differential/Platelet -     TSH -     RPR -     HIV antibody -  Hepatitis C antibody  Dyspnea, unspecified type  Encounter for hepatitis C screening test for low risk patient -     Hepatitis C antibody  Follow-up pending labs, yearly for physical

## 2017-05-12 NOTE — Addendum Note (Signed)
Addended by: Tyrone Apple on: 05/12/2017 09:40 AM   Modules accepted: Orders

## 2017-05-12 NOTE — Addendum Note (Signed)
Addended by: Tyrone Apple on: 05/12/2017 10:36 AM   Modules accepted: Orders

## 2017-05-13 ENCOUNTER — Other Ambulatory Visit: Payer: Self-pay | Admitting: Medical

## 2017-05-13 LAB — LIPID PANEL
CHOL/HDL RATIO: 8.4 (calc) — AB (ref <5.0)
Cholesterol: 219 mg/dL — ABNORMAL HIGH (ref <200)
HDL: 26 mg/dL — ABNORMAL LOW (ref >40)
LDL CHOLESTEROL (CALC): 164 mg/dL — AB
NON-HDL CHOLESTEROL (CALC): 193 mg/dL — AB (ref <130)
TRIGLYCERIDES: 145 mg/dL (ref <150)

## 2017-05-13 LAB — HEPATIC FUNCTION PANEL
AG RATIO: 1.8 (calc) (ref 1.0–2.5)
ALBUMIN MSPROF: 4.2 g/dL (ref 3.6–5.1)
ALT: 33 U/L (ref 9–46)
AST: 19 U/L (ref 10–35)
Alkaline phosphatase (APISO): 44 U/L (ref 40–115)
BILIRUBIN TOTAL: 0.5 mg/dL (ref 0.2–1.2)
Bilirubin, Direct: 0.1 mg/dL (ref 0.0–0.2)
Globulin: 2.4 g/dL (calc) (ref 1.9–3.7)
Indirect Bilirubin: 0.4 mg/dL (calc) (ref 0.2–1.2)
TOTAL PROTEIN: 6.6 g/dL (ref 6.1–8.1)

## 2017-05-13 LAB — CBC WITH DIFFERENTIAL/PLATELET
BASOS PCT: 0.8 %
Basophils Absolute: 59 cells/uL (ref 0–200)
EOS ABS: 237 {cells}/uL (ref 15–500)
Eosinophils Relative: 3.2 %
HEMATOCRIT: 39.9 % (ref 38.5–50.0)
HEMOGLOBIN: 13.5 g/dL (ref 13.2–17.1)
LYMPHS ABS: 1894 {cells}/uL (ref 850–3900)
MCH: 28.8 pg (ref 27.0–33.0)
MCHC: 33.8 g/dL (ref 32.0–36.0)
MCV: 85.3 fL (ref 80.0–100.0)
MPV: 11 fL (ref 7.5–12.5)
Monocytes Relative: 10.6 %
Neutro Abs: 4425 cells/uL (ref 1500–7800)
Neutrophils Relative %: 59.8 %
Platelets: 285 10*3/uL (ref 140–400)
RBC: 4.68 10*6/uL (ref 4.20–5.80)
RDW: 13.5 % (ref 11.0–15.0)
Total Lymphocyte: 25.6 %
WBC: 7.4 10*3/uL (ref 3.8–10.8)
WBCMIX: 784 {cells}/uL (ref 200–950)

## 2017-05-13 LAB — BASIC METABOLIC PANEL
BUN: 11 mg/dL (ref 7–25)
CHLORIDE: 105 mmol/L (ref 98–110)
CO2: 26 mmol/L (ref 20–32)
CREATININE: 1.07 mg/dL (ref 0.70–1.33)
Calcium: 9.4 mg/dL (ref 8.6–10.3)
Glucose, Bld: 101 mg/dL — ABNORMAL HIGH (ref 65–99)
Potassium: 4.4 mmol/L (ref 3.5–5.3)
Sodium: 139 mmol/L (ref 135–146)

## 2017-05-13 LAB — HEMOGLOBIN A1C
EAG (MMOL/L): 5.8 (calc)
Hgb A1c MFr Bld: 5.3 % of total Hgb (ref <5.7)
Mean Plasma Glucose: 105 (calc)

## 2017-05-13 LAB — HEPATITIS C ANTIBODY
Hepatitis C Ab: NONREACTIVE
SIGNAL TO CUT-OFF: 0.34 (ref <1.00)

## 2017-05-13 LAB — MICROALBUMIN / CREATININE URINE RATIO
CREATININE, URINE: 153 mg/dL (ref 20–320)
Microalb Creat Ratio: 7 mcg/mg creat (ref <30)
Microalb, Ur: 1.1 mg/dL

## 2017-05-13 LAB — PSA: PSA: 0.5 ng/mL (ref <=4.0)

## 2017-05-13 LAB — HIV ANTIBODY (ROUTINE TESTING W REFLEX): HIV 1&2 Ab, 4th Generation: NONREACTIVE

## 2017-05-13 LAB — RPR: RPR: NONREACTIVE

## 2017-05-13 LAB — TSH: TSH: 1.48 m[IU]/L (ref 0.40–4.50)

## 2017-05-13 MED ORDER — ASPIRIN EC 81 MG PO TBEC: 81.0000 mg | DELAYED_RELEASE_TABLET | Freq: Every day | ORAL | 3 refills | Status: DC

## 2017-05-13 MED ORDER — FENOFIBRATE 145 MG PO TABS: 145.0000 mg | ORAL_TABLET | Freq: Every day | ORAL | 3 refills | Status: DC

## 2017-05-13 MED ORDER — ATORVASTATIN CALCIUM 40 MG PO TABS
40.0000 mg | ORAL_TABLET | Freq: Every day | ORAL | 3 refills | Status: AC
Start: 2017-05-13 — End: ?

## 2017-05-20 ENCOUNTER — Ambulatory Visit (INDEPENDENT_AMBULATORY_CARE_PROVIDER_SITE_OTHER): Payer: Self-pay | Admitting: *Deleted

## 2017-05-20 DIAGNOSIS — I639 Cerebral infarction, unspecified: Secondary | ICD-10-CM

## 2017-05-23 NOTE — Progress Notes (Signed)
Carelink Summary Report / Loop Recorder 

## 2017-06-06 LAB — CUP PACEART REMOTE DEVICE CHECK
Implantable Pulse Generator Implant Date: 20160429
MDC IDC SESS DTM: 20181117004048

## 2017-06-14 ENCOUNTER — Telehealth: Payer: Self-pay | Admitting: Family Medicine

## 2017-06-14 ENCOUNTER — Other Ambulatory Visit: Payer: Self-pay | Admitting: Medical

## 2017-06-14 MED ORDER — PANTOPRAZOLE SODIUM 40 MG PO TBEC: 40.0000 mg | DELAYED_RELEASE_TABLET | Freq: Every day | ORAL | 0 refills | Status: DC

## 2017-06-14 NOTE — Telephone Encounter (Signed)
[facility] req Pantoprazole SOD DR 40 MG tab #90

## 2017-06-20 ENCOUNTER — Ambulatory Visit (INDEPENDENT_AMBULATORY_CARE_PROVIDER_SITE_OTHER): Payer: Self-pay | Admitting: *Deleted

## 2017-06-20 DIAGNOSIS — I639 Cerebral infarction, unspecified: Secondary | ICD-10-CM

## 2017-06-20 NOTE — Progress Notes (Signed)
Carelink Summary Report / Loop Recorder 

## 2017-06-30 LAB — CUP PACEART REMOTE DEVICE CHECK
Date Time Interrogation Session: 20181217003840
MDC IDC PG IMPLANT DT: 20160429

## 2017-07-08 ENCOUNTER — Ambulatory Visit (INDEPENDENT_AMBULATORY_CARE_PROVIDER_SITE_OTHER): Payer: BLUE CROSS/BLUE SHIELD | Admitting: Medical

## 2017-07-08 ENCOUNTER — Encounter: Payer: Self-pay | Admitting: Medical

## 2017-07-08 VITALS — BP 114/78 | HR 88 | Temp 98.1°F | Wt 239.2 lb

## 2017-07-08 DIAGNOSIS — J40 Bronchitis, not specified as acute or chronic: Secondary | ICD-10-CM | POA: Diagnosis not present

## 2017-07-08 DIAGNOSIS — Z7189 Other specified counseling: Secondary | ICD-10-CM | POA: Diagnosis not present

## 2017-07-08 DIAGNOSIS — R059 Cough, unspecified: Secondary | ICD-10-CM

## 2017-07-08 DIAGNOSIS — Z23 Encounter for immunization: Secondary | ICD-10-CM | POA: Diagnosis not present

## 2017-07-08 DIAGNOSIS — Z7185 Encounter for immunization safety counseling: Secondary | ICD-10-CM

## 2017-07-08 DIAGNOSIS — R05 Cough: Secondary | ICD-10-CM | POA: Diagnosis not present

## 2017-07-08 MED ORDER — PROMETHAZINE-DM 6.25-15 MG/5ML PO SYRP: 5.0000 mL | ORAL_SOLUTION | Freq: Four times a day (QID) | ORAL | 0 refills | Status: AC | PRN

## 2017-07-08 MED ORDER — AZITHROMYCIN 250 MG PO TABS: ORAL_TABLET | ORAL | 0 refills | Status: AC

## 2017-07-08 NOTE — Patient Instructions (Addendum)
Recommendations:  Begin Zpak antibiotic   Begin Promethazine DM cough syrup as needed  You can use Mucinex plain over the counter for congestion/mucous  Rest, drink plenty of water  Be a little careful with juice particular the grape juice as there is usually a lot of sugar in juices which can lead to weight gain  Drink mainly water  If you are not much better by mid next week or if you gain another 3-5 lb by mid next week, then call back and we will do a chest xray  We updated your flu shot today  Plan to return in 2 weeks for a Pneumococcal 23 vaccine (pneumonia shot)  I recommend you have a shingles vaccine to help prevent shingles or herpes zoster outbreak.   Please call your insurer to inquire about coverage for the Shingrix vaccine given in 2 doses.   Some insurers cover this vaccine after age 22, some cover this after age 4.  If your insurer covers this, then call to schedule appointment to have this vaccine here.

## 2017-07-08 NOTE — Progress Notes (Signed)
Subjective: Chief Complaint  Patient presents with  . coughing and congestion    coughing and burning in his chest    Here for cough, burning in chest, chest congestion since day after Christmas.  This past week was feeling better, then all the sudden feels like symptoms have worsened again.  Smokes some, not much lately.   Have felt low grade fever at times.  Has felt hot at times.   Getting green mucous with cough this past week.  No NVD.   Having some head congestion, headache, pressure behind eyes.   Has had some sore throat.  He goes to group counseling meetings, and recently had sick contact at group that he shook hands with.    No chest pain, no edema.   He recently switched from colas to juice.  Has gained some weight al thought he was expecting weight loss.  No other aggravating or relieving factors. No other complaint.    Past Medical History:  Diagnosis Date  . ADD (attention deficit disorder)   . Anxiety   . Back pain   . Bipolar depression ([place])   . CHF (congestive heart failure) ([place])   . Congenital heart disease   . Congenital heart disease    ? self reported valve issue  . CVA (cerebral vascular accident) ([place])    embolic stroke 02/6760  . Depression   . Essential hypertension   . GERD (gastroesophageal reflux disease)   . Hyperlipidemia   . Illiteracy   . Substance abuse ([employer])    hx/o alcohol, marijanua abuse   Current Outpatient Medications on File Prior to Visit  Medication Sig Dispense Refill  . aspirin EC 81 MG tablet Take 1 tablet (81 mg total) daily by mouth. 90 tablet 3  . atorvastatin (LIPITOR) 40 MG tablet Take 1 tablet (40 mg total) daily at 6 PM by mouth. 90 tablet 3  . diazepam (VALIUM) 10 MG tablet Take 10 mg by mouth at bedtime.    . divalproex (DEPAKOTE ER) 500 MG 24 hr tablet Take 1,000 mg by mouth 2 (two) times daily.     . fenofibrate (TRICOR) 145 MG tablet Take 1 tablet (145 mg total) daily by mouth. 90 tablet 3  . pantoprazole (PROTONIX) 40 MG  tablet Take 1 tablet (40 mg total) by mouth daily. 90 tablet 0   No current facility-administered medications on file prior to visit.    ROS as in subjective   Objective: BP 114/78   Pulse 88   Temp 98.1 F (36.7 C)   Wt 239 lb 3.2 oz (108.5 kg)   SpO2 96%   BMI 39.20 kg/m   Wt Readings from Last 3 Encounters:  07/08/17 239 lb 3.2 oz (108.5 kg)  05/12/17 228 lb (103.4 kg)  10/07/16 222 lb (100.7 kg)    General appearance: Alert, WD/WN, no distress                             Skin: warm, no rash, no diaphoresis                           Head: no sinus tenderness                            Eyes: conjunctiva normal, corneas clear, PERRLA  Ears: pearly TMs, external ear canals normal                          Nose: septum midline, turbinates swollen, with erythema and clear discharge             Mouth/throat: MMM, tongue normal, mild pharyngeal erythema                           Neck: supple, no adenopathy, no thyromegaly, non tender                          Heart: RRR, normal S1, S2, no murmurs                         Lungs: +bronchial breath sounds, no rhonchi, no wheezes, no rales                Extremities: no edema, non tender     Assessment: Encounter Diagnoses  Name Primary?  . Cough Yes  . Bronchitis   . Need for influenza vaccination   . Vaccine counseling     Plan: Discussed symptoms, concerns, rest, hydration and discussed recommendations below.  Patient Instructions  Recommendations:  Begin Zpak antibiotic   Begin Promethazine DM cough syrup as needed  You can use Mucinex plain over the counter for congestion/mucous  Rest, drink plenty of water  Be a little careful with juice particular the grape juice as there is usually a lot of sugar in juices which can lead to weight gain  Drink mainly water  If you are not much better by mid next week or if you gain another 3-5 lb by mid next week, then call back and we will do a  chest xray  We updated your flu shot today  Plan to return in 2 weeks for a Pneumococcal 23 vaccine (pneumonia shot)  I recommend you have a shingles vaccine to help prevent shingles or herpes zoster outbreak.   Please call your insurer to inquire about coverage for the Shingrix vaccine given in 2 doses.   Some insurers cover this vaccine after age 33, some cover this after age 53.  If your insurer covers this, then call to schedule appointment to have this vaccine here.   Counseled on the influenza virus vaccine.  Vaccine information sheet given.  Influenza vaccine given after consent obtained.  Jyren was seen today for coughing and congestion.  Diagnoses and all orders for this visit:  Cough  Bronchitis  Need for influenza vaccination -     Flu Vaccine QUAD 6+ mos PF IM (Fluarix Quad PF)  Vaccine counseling  Other orders -     promethazine-dextromethorphan (PROMETHAZINE-DM) 6.25-15 MG/5ML syrup; Take 5 mLs by mouth 4 (four) times daily as needed for cough. -     azithromycin (ZITHROMAX) 250 MG tablet; 2 tablets day 1, then 1 tablet days 2-4

## 2017-07-19 ENCOUNTER — Ambulatory Visit (INDEPENDENT_AMBULATORY_CARE_PROVIDER_SITE_OTHER): Payer: Self-pay | Admitting: *Deleted

## 2017-07-19 DIAGNOSIS — I639 Cerebral infarction, unspecified: Secondary | ICD-10-CM

## 2017-07-20 NOTE — Progress Notes (Signed)
Carelink Summary Report / Loop Recorder 

## 2017-07-29 LAB — CUP PACEART REMOTE DEVICE CHECK
Implantable Pulse Generator Implant Date: 20160429
MDC IDC SESS DTM: 20190116010907

## 2017-08-18 ENCOUNTER — Ambulatory Visit (INDEPENDENT_AMBULATORY_CARE_PROVIDER_SITE_OTHER): Payer: Self-pay | Admitting: *Deleted

## 2017-08-18 DIAGNOSIS — I639 Cerebral infarction, unspecified: Secondary | ICD-10-CM

## 2017-08-22 NOTE — Progress Notes (Signed)
Carelink Summary Report / Loop Recorder 

## 2017-09-09 ENCOUNTER — Other Ambulatory Visit: Payer: Self-pay

## 2017-09-09 MED ORDER — PANTOPRAZOLE SODIUM 40 MG PO TBEC: 40.0000 mg | DELAYED_RELEASE_TABLET | Freq: Every day | ORAL | 0 refills | Status: AC

## 2017-09-13 ENCOUNTER — Other Ambulatory Visit: Payer: Self-pay | Admitting: Medical

## 2017-09-15 LAB — CUP PACEART REMOTE DEVICE CHECK
Implantable Pulse Generator Implant Date: 20160429
MDC IDC SESS DTM: 20190215013932

## 2017-09-20 ENCOUNTER — Ambulatory Visit (INDEPENDENT_AMBULATORY_CARE_PROVIDER_SITE_OTHER): Payer: Self-pay | Admitting: *Deleted

## 2017-09-20 DIAGNOSIS — I639 Cerebral infarction, unspecified: Secondary | ICD-10-CM

## 2017-09-21 NOTE — Progress Notes (Signed)
Carelink Summary Report / Loop Recorder 

## 2017-10-24 ENCOUNTER — Ambulatory Visit (INDEPENDENT_AMBULATORY_CARE_PROVIDER_SITE_OTHER): Payer: Self-pay | Admitting: *Deleted

## 2017-10-24 DIAGNOSIS — I639 Cerebral infarction, unspecified: Secondary | ICD-10-CM

## 2017-10-24 NOTE — Progress Notes (Signed)
Carelink Summary Report / Loop Recorder 

## 2017-10-28 ENCOUNTER — Telehealth: Payer: Self-pay | Admitting: *Deleted

## 2017-10-28 NOTE — Telephone Encounter (Signed)
DPR on file to talk to [place] w/ pt wife and informed her that pt loop recorder has reached RRT and of the options. Pt wife wants to schedule an appt w/ MD.

## 2017-10-28 NOTE — Telephone Encounter (Signed)
LMOVM for patient's wife (all listed phone numbers are the same).  Requested call back regarding patient's loop recorder, gave direct number.  LINQ at RRT as of 10/20/17. Will discuss options.

## 2017-10-29 LAB — CUP PACEART REMOTE DEVICE CHECK
Date Time Interrogation Session: 20190320020659
MDC IDC PG IMPLANT DT: 20160429

## 2017-11-14 ENCOUNTER — Ambulatory Visit (INDEPENDENT_AMBULATORY_CARE_PROVIDER_SITE_OTHER): Payer: BLUE CROSS/BLUE SHIELD | Admitting: Internal Medicine

## 2017-11-14 ENCOUNTER — Encounter (INDEPENDENT_AMBULATORY_CARE_PROVIDER_SITE_OTHER): Payer: Self-pay

## 2017-11-14 ENCOUNTER — Encounter: Payer: Self-pay | Admitting: Internal Medicine

## 2017-11-14 VITALS — BP 112/60 | HR 76 | Ht 66.0 in | Wt 229.0 lb

## 2017-11-14 DIAGNOSIS — R42 Dizziness and giddiness: Secondary | ICD-10-CM

## 2017-11-14 DIAGNOSIS — G4730 Sleep apnea, unspecified: Secondary | ICD-10-CM

## 2017-11-14 DIAGNOSIS — R079 Chest pain, unspecified: Secondary | ICD-10-CM | POA: Diagnosis not present

## 2017-11-14 DIAGNOSIS — I639 Cerebral infarction, unspecified: Secondary | ICD-10-CM

## 2017-11-14 NOTE — Progress Notes (Signed)
Patient Care Team: Tysinger, Leward Quan as PCP - General (Family Medicine)   HPI  [name] is a 52 y.o. male Seen as his implantable loop recorder has reached RRT.  Implanted 4/16 for cryptogenic stroke.  He comes in with multiple complaints.  The first is exercise intolerance accompanied by chest discomfort with radiation into the neck and arms.  He describes it as a heaviness.  It is accompanied by shortness of breath and fatigue.  Is also triggered by anger.   He has dizziness.  This occurs with exertion; he has noted it when climbing ladders.  He refers to having recurrent "panic attacks".  These are episodes unassociated with palpitations but are characterized by weakness, flushing, clammy and diaphoretic pallor with significant residual fatigue.  There is no specific trigger.  He has sleep disordered breathing and significant daytime fatigue.  He also describes his efforts at being sober.  He is now been sober for about 14 months.  This follows decades and decades of alcohol abuse.  He is also seeing psychiatry for anger management and has made great headway.  He describes himself as being where the youngest people to undergone catheterization when he was 52 years old.  This was related to some kind of blood clotting problem and he was [facility].  He ended up with vein stripping as he says a heart catheterization  Past Medical History:  Diagnosis Date  . ADD (attention deficit disorder)   . Anxiety   . Back pain   . Bipolar depression ([place])   . CHF (congestive heart failure) ([place])   . Congenital heart disease   . Congenital heart disease    ? self reported valve issue  . CVA (cerebral vascular accident) ([place])    embolic stroke 02/8415  . Depression   . Essential hypertension   . GERD (gastroesophageal reflux disease)   . Hyperlipidemia   . Illiteracy   . Substance abuse ([place])    hx/o alcohol, marijanua abuse    Past Surgical History:    Procedure Laterality Date  . BACK SURGERY Bilateral 1996  . CARDIAC CATHETERIZATION     at age 58  . CARPAL TUNNEL RELEASE     bilat  . INSERTION OF MESH N/A 05/08/2015   Procedure: INSERTION OF MESH;  Surgeon: Erroll Luna, MD;  Location: [facility];  Service: General;  Laterality: N/A;  . LIPOMA EXCISION Right 05/06/2016   Procedure: EXCISION LIPOMA RIGHT THIGH POSTERIOR;  Surgeon: Erroll Luna, MD;  Location: [facility];  Service: General;  Laterality: Right;  . LOOP RECORDER IMPLANT N/A 11/01/2014   Procedure: LOOP RECORDER IMPLANT;  Surgeon: Deboraha Sprang, MD;  Location: [facility] CATH LAB;  Service: Cardiovascular;  Laterality: N/A;  . SKIN GRAFT    . TEE WITHOUT CARDIOVERSION N/A 11/01/2014   Procedure: TRANSESOPHAGEAL ECHOCARDIOGRAM (TEE);  Surgeon: Josue Hector, MD;  Location: [facility];  Service: Cardiovascular;  Laterality: N/A;  . UMBILICAL HERNIA REPAIR N/A 05/08/2015   Procedure: UMBILICAL HERNIA REPAIR WITH MESH;  Surgeon: Erroll Luna, MD;  Location: [facility];  Service: General;  Laterality: N/A;    Current Meds  Medication Sig  . aspirin EC 81 MG tablet Take 1 tablet (81 mg total) daily by mouth.  Marland Kitchen atorvastatin (LIPITOR) 40 MG tablet Take 1 tablet (40 mg total) daily at 6 PM by mouth.  Marland Kitchen azithromycin (ZITHROMAX) 250 MG tablet 2 tablets day  1, then 1 tablet days 2-4  . diazepam (VALIUM) 10 MG tablet Take 10 mg by mouth at bedtime.  . divalproex (DEPAKOTE ER) 500 MG 24 hr tablet Take 1,000 mg by mouth 2 (two) times daily.   . fenofibrate (TRICOR) 145 MG tablet Take 1 tablet (145 mg total) daily by mouth.  . pantoprazole (PROTONIX) 40 MG tablet Take 1 tablet (40 mg total) by mouth daily.  . pantoprazole (PROTONIX) 40 MG tablet TAKE 1 TABLET BY MOUTH EVERY DAY  . promethazine-dextromethorphan (PROMETHAZINE-DM) 6.25-15 MG/5ML syrup Take 5 mLs by mouth 4 (four) times daily as needed for cough.    Allergies  Allergen  Reactions  . Codeine Nausea And Vomiting      Review of Systems negative except from HPI and PMH  Physical Exam BP 112/60   Pulse 76   Ht 5\' 6"  (1.676 m)   Wt 229 lb (103.9 kg)   SpO2 98%   BMI 36.96 kg/m  Well developed and well nourished in no acute distress HENT normal E scleral and icterus clear Neck Supple JVP flat; carotids brisk and full Clear to ausculation Regular rate and rhythm, no murmurs gallops or rub Soft with active bowel sounds No clubbing cyanosis  Edema Alert and oriented, grossly normal motor and sensory function Skin Warm and Dry  ECG sinus 76 06/11/36 Normal sinus   Assessment and  Plan  Stroke  Implantable Linq  Exertional Chest Discomfort  Sleep Disordered breathing  Bipolar/ Anger  Spells ? Neurally mediated   He has multiple symptoms which may be related to his stroke--fatigue, dizziness, and aphasia.  I have suggested that he followup with neurology to help with his disability.  His symptoms of chest discomfort are concerning particularly in light of the accompanying symptoms and the exertional and emotional trigger.  The pretest likelihood is sufficiently high and I think catheterization is indicated.  I have asked him to consider this and to think about whether he like to have it done here or down to [facility] where his procedure was done 50+ years ago.  CT scanning with FFR might serve as an intermediate tool if there is procedural concerns about the catheterization.  I am not sure what is the call for his spells that seem like they are neurally mediated.  They are described as panic attacks but they do not sound like panic attacks to me.  I suggested that now that his Linq is at RRT, it could be replaced with the purpose of looking for an arrhythmic trigger for his episodes.  He is worked hard on his anger.  I suggested that he consider some other cognitive therapy in addition psychiatry.  He will talk with his psychiatrist about  this.  He also needs and will be referred for a sleep study   More than 50% of 45 min was spent in counseling related to the above          Current medicines are reviewed at length with the patient today .  The patient does not  have concerns regarding medicines.

## 2017-11-14 NOTE — Patient Instructions (Addendum)
Medication Instructions:  None ordered.  Labwork: Your physician recommends that you return for lab work before your heart cath for pre procedure.   BMP, CBC, PT/INR   Testing/Procedures: Your physician has requested that you have a cardiac catheterization. Cardiac catheterization is used to diagnose and/or treat various heart conditions. Doctors may recommend this procedure for a number of different reasons. The most common reason is to evaluate chest pain. Chest pain can be a symptom of coronary artery disease (CAD), and cardiac catheterization can show whether plaque is narrowing or blocking your heart's arteries. This procedure is also used to evaluate the valves, as well as measure the blood flow and oxygen levels in different parts of your heart. For further information please visit [email or web address]. Please follow instruction sheet, as given.  Your physician has requested that you have an echocardiogram. Echocardiography is a painless test that uses sound waves to create images of your heart. It provides your doctor with information about the size and shape of your heart and how well your heart's chambers and valves are working. This procedure takes approximately one hour. There are no restrictions for this procedure.   Follow-Up: You will follow up with Dr [name] based off your test results.   Any Other Special Instructions Will Be Listed Below (If Applicable).     If you need a refill on your cardiac medications before your next appointment, please call your pharmacy.

## 2017-11-15 ENCOUNTER — Telehealth: Payer: Self-pay

## 2017-11-15 ENCOUNTER — Telehealth: Payer: Self-pay | Admitting: *Deleted

## 2017-11-15 LAB — CUP PACEART INCLINIC DEVICE CHECK
Implantable Pulse Generator Implant Date: 20160429
MDC IDC SESS DTM: 20190513195208

## 2017-11-15 NOTE — Telephone Encounter (Signed)
-----   Message from Dollene Primrose, RN sent at 11/14/2017  5:36 PM EDT ----- Regarding: pre cert Hello   I need a pre cert for this sleep study.  thanks

## 2017-11-15 NOTE — Telephone Encounter (Signed)
Patient's wife notified of June 8th sleep study appointment.

## 2017-11-15 NOTE — Telephone Encounter (Signed)
Spoke with pt's wife regarding L and R Heart Cath. She believes he will have it done at [facility] but will contact me back and let me know. Echo has already been scheduled. I will follow up at a later time regarding linq explant/implant.

## 2017-11-16 NOTE — Addendum Note (Signed)
Addended by: Bobby Rumpf C on: 11/16/2017 10:05 AM   Modules accepted: Orders

## 2017-11-21 LAB — CUP PACEART REMOTE DEVICE CHECK
Date Time Interrogation Session: 20190422034027
Implantable Pulse Generator Implant Date: 20160429

## 2017-12-01 NOTE — Telephone Encounter (Signed)
Late Entry: LM with pt's wife, Larene Beach, on 5/28 to set up R/L Heart Cath. Pt prefers the office call his wife to set up appt's.

## 2017-12-02 ENCOUNTER — Other Ambulatory Visit: Payer: Self-pay

## 2017-12-02 ENCOUNTER — Ambulatory Visit (HOSPITAL_COMMUNITY): Payer: BLUE CROSS/BLUE SHIELD | Attending: Cardiology

## 2017-12-02 DIAGNOSIS — E785 Hyperlipidemia, unspecified: Secondary | ICD-10-CM | POA: Insufficient documentation

## 2017-12-02 DIAGNOSIS — I639 Cerebral infarction, unspecified: Secondary | ICD-10-CM

## 2017-12-02 DIAGNOSIS — I509 Heart failure, unspecified: Secondary | ICD-10-CM | POA: Diagnosis not present

## 2017-12-02 DIAGNOSIS — Z720 Tobacco use: Secondary | ICD-10-CM | POA: Diagnosis not present

## 2017-12-02 DIAGNOSIS — Z6837 Body mass index (BMI) 37.0-37.9, adult: Secondary | ICD-10-CM | POA: Insufficient documentation

## 2017-12-02 DIAGNOSIS — I110 Hypertensive heart disease with heart failure: Secondary | ICD-10-CM | POA: Insufficient documentation

## 2017-12-10 ENCOUNTER — Ambulatory Visit (HOSPITAL_BASED_OUTPATIENT_CLINIC_OR_DEPARTMENT_OTHER): Payer: BLUE CROSS/BLUE SHIELD | Attending: Internal Medicine | Admitting: Cardiovascular Disease

## 2017-12-10 VITALS — Ht 66.0 in | Wt 240.0 lb

## 2017-12-10 DIAGNOSIS — I639 Cerebral infarction, unspecified: Secondary | ICD-10-CM | POA: Diagnosis not present

## 2017-12-10 DIAGNOSIS — R0902 Hypoxemia: Secondary | ICD-10-CM | POA: Insufficient documentation

## 2017-12-10 DIAGNOSIS — G4733 Obstructive sleep apnea (adult) (pediatric): Secondary | ICD-10-CM | POA: Insufficient documentation

## 2017-12-10 DIAGNOSIS — Z7982 Long term (current) use of aspirin: Secondary | ICD-10-CM | POA: Diagnosis not present

## 2017-12-10 DIAGNOSIS — G4736 Sleep related hypoventilation in conditions classified elsewhere: Secondary | ICD-10-CM | POA: Insufficient documentation

## 2017-12-10 DIAGNOSIS — I493 Ventricular premature depolarization: Secondary | ICD-10-CM | POA: Insufficient documentation

## 2017-12-10 DIAGNOSIS — Z79899 Other long term (current) drug therapy: Secondary | ICD-10-CM | POA: Insufficient documentation

## 2017-12-15 NOTE — Procedures (Signed)
   Patient Name: Brent, [name] Study Date:06/21/2017 12/10/2017 Gender: Male D.O.B: [birth year] Age (years): 51 Referring Provider: Virl Axe Height (inches): 57 Interpreting Physician: Fransico Him MD, ABSM Weight (lbs): 240 RPSGT: [place], [place] BMI: 39 MRN: [id number] Neck Size: 19.00  CLINICAL INFORMATION Sleep Study Type: NPSG  Indication for sleep study: Hypertension, Snoring  Epworth Sleepiness Score: 10  SLEEP STUDY TECHNIQUE As per the AASM Manual for the Scoring of Sleep and Associated Events v2.3 (April 2016) with a hypopnea requiring 4% desaturations.  The channels recorded and monitored were frontal, central and occipital EEG, electrooculogram (EOG), submentalis EMG (chin), nasal and oral airflow, thoracic and abdominal wall motion, anterior tibialis EMG, snore microphone, electrocardiogram, and pulse oximetry.  MEDICATIONS Medications self-administered by patient taken the night of the study : VALIUM, Divalproex ER, Atorvastatin, Fenofibrate, Aspirin  SLEEP ARCHITECTURE The study was initiated at 9:56:24 PM and ended at 4:03:09 AM.  Sleep onset time was 42.9 minutes and the sleep efficiency was 58.5%%. The total sleep time was 214.5 minutes.  Stage REM latency was 74.0 minutes.  The patient spent 7.2%% of the night in stage N1 sleep, 75.3%% in stage N2 sleep, 0.0%% in stage N3 and 17.48% in REM.  Alpha intrusion was absent.  Supine sleep was 17.07%.  RESPIRATORY PARAMETERS The overall apnea/hypopnea index (AHI) was 10.3 per hour. There were 4 total apneas, including 4 obstructive, 0 central and 0 mixed apneas. There were 33 hypopneas and 18 RERAs.  The AHI during Stage REM sleep was 20.8 per hour.  AHI while supine was 21.3 per hour.  The mean oxygen saturation was 91.6%. The minimum SpO2 during sleep was 77.0%.  loud snoring was noted during this study.  CARDIAC DATA The 2 lead EKG demonstrated sinus rhythm. The mean heart rate was 67.1 beats  per minute. Other EKG findings include: PVCs.  LEG MOVEMENT DATA The total PLMS were 0 with a resulting PLMS index of 0.0. Associated arousal with leg movement index was 0.0 .  IMPRESSIONS - Mild obstructive sleep apnea occurred during this study (AHI = 10.3/h). - No significant central sleep apnea occurred during this study (CAI = 0.0/h). - Moderate oxygen desaturation was noted during this study (Min O2 = 77.0%). - The patient snored with loud snoring volume. - EKG findings include PVCs. - Clinically significant periodic limb movements did not occur during sleep. No significant associated arousals.  DIAGNOSIS - Obstructive Sleep Apnea (327.23 [G47.33 ICD-10]) - Nocturnal Hypoxemia (327.26 [G47.36 ICD-10])  RECOMMENDATIONS - Therapeutic CPAP titration to determine optimal pressure required to alleviate sleep disordered breathing. - Positional therapy avoiding supine position during sleep. - Avoid alcohol, sedatives and other CNS depressants that may worsen sleep apnea and disrupt normal sleep architecture. - Sleep hygiene should be reviewed to assess factors that may improve sleep quality. - Weight management and regular exercise should be initiated or continued if appropriate.  [Electronically signed] 12/15/2017 07:46 PM  Fransico Him MD, ABSM Diplomate, American Board of Sleep Medicine

## 2017-12-19 NOTE — Telephone Encounter (Signed)
Called results Lmtcb

## 2017-12-27 ENCOUNTER — Telehealth: Payer: Self-pay | Admitting: Cardiovascular Disease

## 2017-12-27 NOTE — Telephone Encounter (Signed)
New Message   Pt's wife is calling to check on the results to pt's sleep study please call

## 2017-12-27 NOTE — Telephone Encounter (Signed)
Please call with results , thanks

## 2017-12-28 ENCOUNTER — Telehealth: Payer: Self-pay | Admitting: *Deleted

## 2017-12-28 DIAGNOSIS — G4730 Sleep apnea, unspecified: Secondary | ICD-10-CM

## 2017-12-28 NOTE — Telephone Encounter (Signed)
Note    Informed patient/wife per DPR of sleep study results and patient understanding was verbalized. Patient/wife understands his sleep study showed they have sleep apnea and recommend CPAP titration. Pt/wife is aware and agreeable to these results.

## 2017-12-28 NOTE — Telephone Encounter (Signed)
[name] please call patient with results and recommendations.

## 2017-12-28 NOTE — Telephone Encounter (Addendum)
Informed patient/wife per DPR of sleep study results and patient understanding was verbalized. Patient/wife understands his sleep study showed they have sleep apnea and recommend CPAP titration. Pt/wife is aware and agreeable to these results.

## 2017-12-28 NOTE — Telephone Encounter (Signed)
cpap titration sent to sleep pool

## 2017-12-29 ENCOUNTER — Telehealth: Payer: Self-pay | Admitting: *Deleted

## 2017-12-29 NOTE — Telephone Encounter (Signed)
PA submitted to [facility] for in lab CPAP titration study.

## 2018-01-02 ENCOUNTER — Telehealth: Payer: Self-pay | Admitting: *Deleted

## 2018-01-02 DIAGNOSIS — G4733 Obstructive sleep apnea (adult) (pediatric): Secondary | ICD-10-CM

## 2018-01-02 NOTE — Telephone Encounter (Signed)
-----   Message from Lauralee Evener, [place] sent at 12/30/2017  4:24 PM EDT ----- Regarding: RE: precert [employer] denied in lab titration study. Recommends APAP. States no reasons cannot be done through home device. Please notify provider. ----- Message ----- From: Freada Bergeron, CMA Sent: 12/28/2017   4:12 PM To: [facility] Subject: precert                                        CPAP Titration

## 2018-01-02 NOTE — Telephone Encounter (Signed)
  Lauralee Evener, CMA  Lurlene Ronda, [place] denied in lab titration study. Recommends APAP. States no reasons cannot be done through home device. Please notify provider.     ----- Message -----  From: Freada Bergeron, CMA  Sent: 12/28/2017  4:12 PM  To: [facility] Sleep [facility]  Subject: precert                      CPAP Titration

## 2018-01-04 NOTE — Telephone Encounter (Signed)
Per Dr [name], [place] for a home sleep study.

## 2018-01-06 NOTE — Telephone Encounter (Signed)
Sent to precert 

## 2018-01-19 ENCOUNTER — Other Ambulatory Visit: Payer: Self-pay | Admitting: Internal Medicine

## 2018-01-20 NOTE — Telephone Encounter (Signed)
  Lauralee Evener, CMA  Freada Bergeron, CMA        This is nothing to schedule. You have to order home auto CPAP Through [employer].     ----- Message -----  From: Freada Bergeron, CMA  Sent: 01/06/2018 10:58 AM  To: [facility] Sleep Studies  Subject: precert                      Home sleep test

## 2018-01-23 NOTE — Telephone Encounter (Signed)
Upon patient request DME selection is [facility]. Patient understands he will be contacted by [facility] to set up his cpap. Patient understands to call if [facility] does not contact his with new setup in a timely manner. Patient understands they will be called once confirmation has been received from [facility] that they have received their new machine to schedule 10 week follow up appointment.  [facility] notified of new cpap order  Please add to airview Patient was grateful for the call and thanked me.

## 2018-01-23 NOTE — Telephone Encounter (Signed)
  Sueanne Margarita, MD  Freada Bergeron, CMA        Please set up Airsense CPAP with autotitration from 4 to 18cm H2O with heated humidity and mask of choice and get download in 2 weeks. Please set up OV with me in 10 weeks   Fransico Him, MD

## 2018-01-27 NOTE — Addendum Note (Signed)
Addended by: Freada Bergeron on: 01/27/2018 12:53 PM   Modules accepted: Orders

## 2018-02-08 ENCOUNTER — Ambulatory Visit: Payer: Self-pay | Admitting: Physician Assistant

## 2018-02-15 ENCOUNTER — Encounter (HOSPITAL_BASED_OUTPATIENT_CLINIC_OR_DEPARTMENT_OTHER): Payer: Self-pay

## 2018-02-28 NOTE — Telephone Encounter (Signed)
Per [facility] CPAP setup for patient has been cancelled due to patient cannot be reached. [facility] has sent out a no contact letter and have faxed our office a copy which will be scanned and put in patients file.

## 2018-03-08 ENCOUNTER — Ambulatory Visit: Payer: Self-pay | Admitting: Physician Assistant

## 2018-03-14 ENCOUNTER — Telehealth: Payer: Self-pay

## 2018-03-14 NOTE — Telephone Encounter (Signed)
Left message on voicemail for patient to call back to schedule appointment.  Form in the brown folder.

## 2018-03-19 ENCOUNTER — Other Ambulatory Visit: Payer: Self-pay | Admitting: Medical

## 2018-05-24 ENCOUNTER — Telehealth: Payer: Self-pay

## 2018-05-24 NOTE — Telephone Encounter (Signed)
lmom asking patient to call the office and schedule a office visit now and for every 6 months.

## 2018-05-31 ENCOUNTER — Other Ambulatory Visit: Payer: Self-pay | Admitting: Medical

## 2018-05-31 NOTE — Telephone Encounter (Signed)
Is this ok to refill?  

## 2018-06-24 ENCOUNTER — Other Ambulatory Visit: Payer: Self-pay | Admitting: Medical

## 2018-06-29 ENCOUNTER — Encounter: Payer: Self-pay | Admitting: Medical

## 2018-06-29 ENCOUNTER — Telehealth: Payer: Self-pay | Admitting: Medical

## 2018-06-29 NOTE — Telephone Encounter (Signed)
Please send no show fee, and call about missed physical this morning.

## 2018-06-29 NOTE — Telephone Encounter (Signed)
See message to you and Juliann Pulse

## 2018-07-10 ENCOUNTER — Encounter: Payer: Self-pay | Admitting: Medical

## 2018-07-10 NOTE — Telephone Encounter (Signed)
No show letter sent/no fee accessed not a cpe

## 2018-07-20 ENCOUNTER — Other Ambulatory Visit: Payer: Self-pay | Admitting: Medical

## 2018-07-20 NOTE — Telephone Encounter (Signed)
Is this refill appropriate?  

## 2018-07-21 NOTE — Telephone Encounter (Signed)
lmom asking patient to call back and schedule appt for further refills.

## 2018-09-17 ENCOUNTER — Other Ambulatory Visit: Payer: Self-pay | Admitting: Medical

## 2018-09-21 ENCOUNTER — Other Ambulatory Visit: Payer: Self-pay | Admitting: Medical

## 2018-09-21 ENCOUNTER — Telehealth: Payer: Self-pay

## 2018-09-21 MED ORDER — FENOFIBRATE 145 MG PO TABS: 145.0000 mg | ORAL_TABLET | Freq: Every day | ORAL | 0 refills | Status: AC

## 2018-09-21 NOTE — Telephone Encounter (Signed)
Patient wife called and lmom stating that patient is finsing a new pcp however he has not been able to get an appointment with the new pcp yet. She stated patient needs a 30 day supply on Fenofibrate. Please advise.

## 2018-09-21 NOTE — Telephone Encounter (Signed)
done

## 2018-10-05 IMAGING — CT CT HEAD W/O CM
3 of 4 series · 16 of 47 positions shown, 19 images · non-contrast
Comparison: 01/13/2016, 10/30/2014

CLINICAL DATA: Change in mental status

EXAM:
CT HEAD WITHOUT CONTRAST
TECHNIQUE: Contiguous axial images were obtained from the base of the skull
through the vertex without intravenous contrast.

[Series 201: head w/o, idose (1) · axial · non-contrast · 0.49mm/px · z∈[+117,+252]mm · 10 of 33 slices shown, 13 images]
[im 3/33  brain]
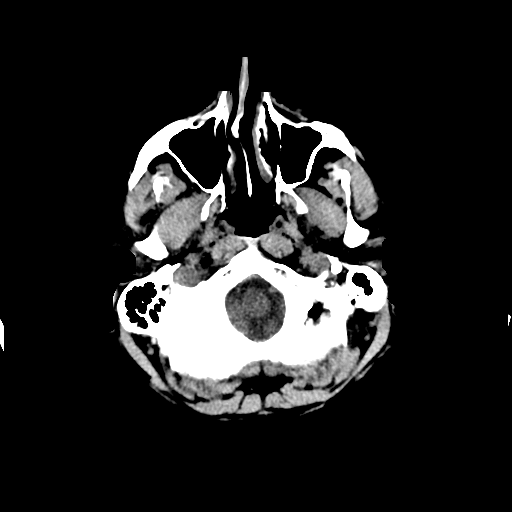
[im 3/33  bone]
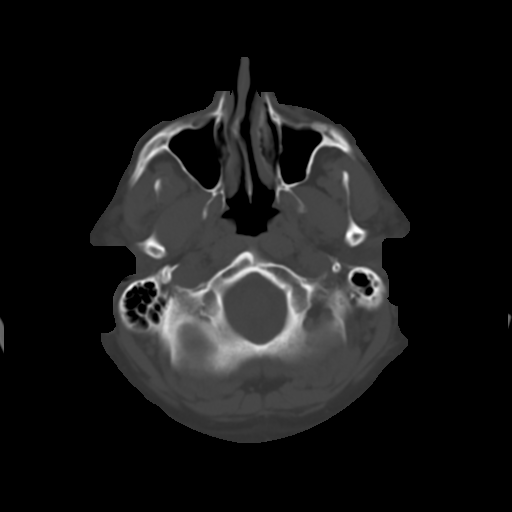
[im 5/33  brain]
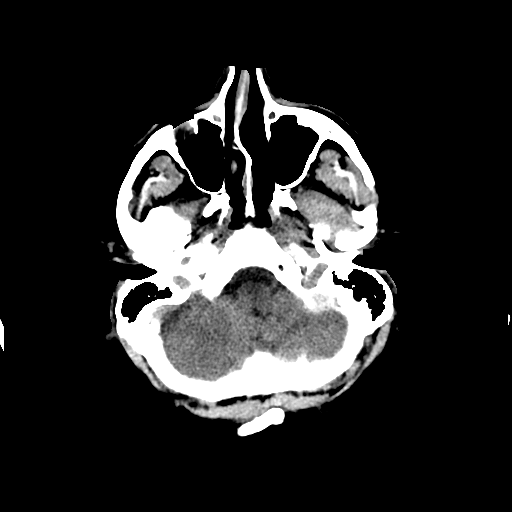
[im 10/33  brain]
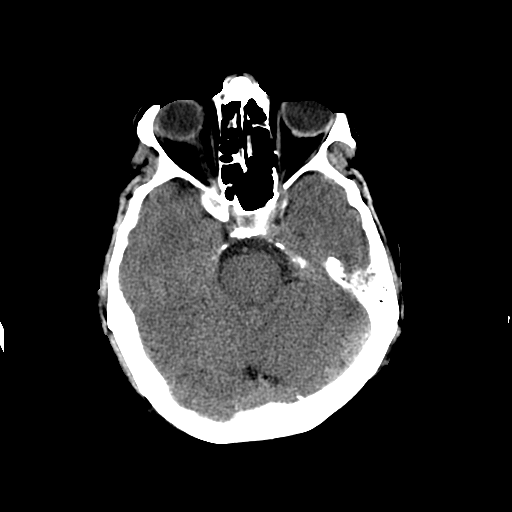
[im 12/33  brain]
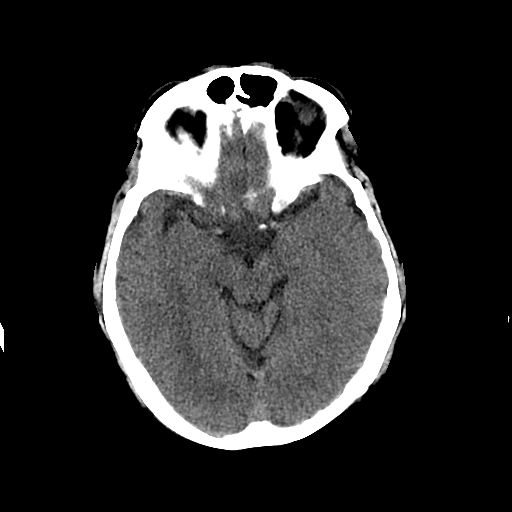
[im 14/33  brain]
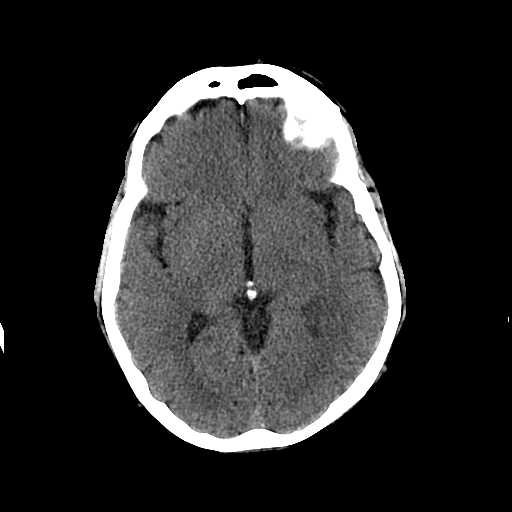
[im 14/33  bone]
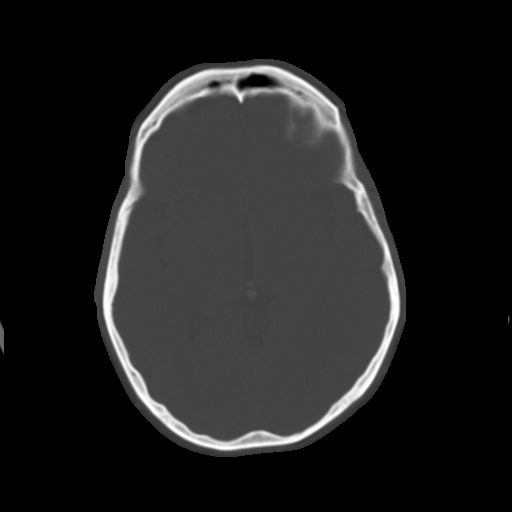
[im 19/33  brain]
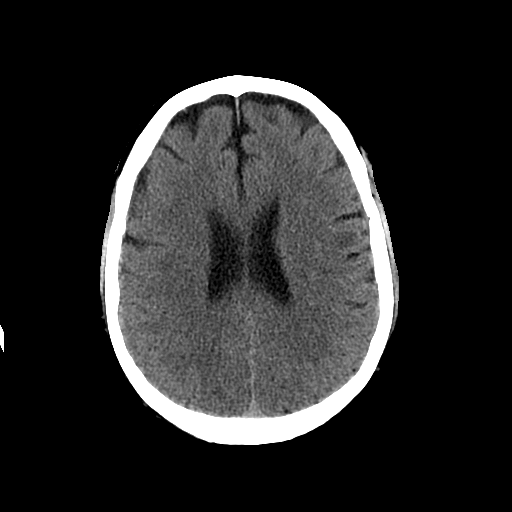
[im 21/33  brain]
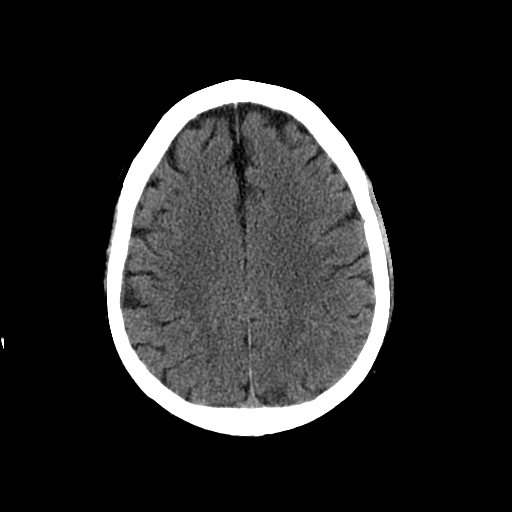
[im 23/33  brain]
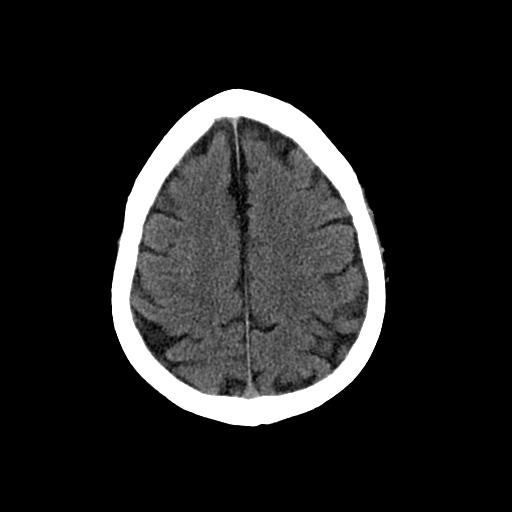
[im 28/33  brain]
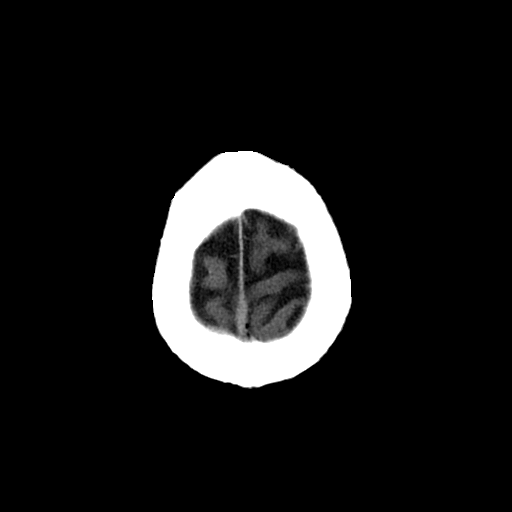
[im 28/33  bone]
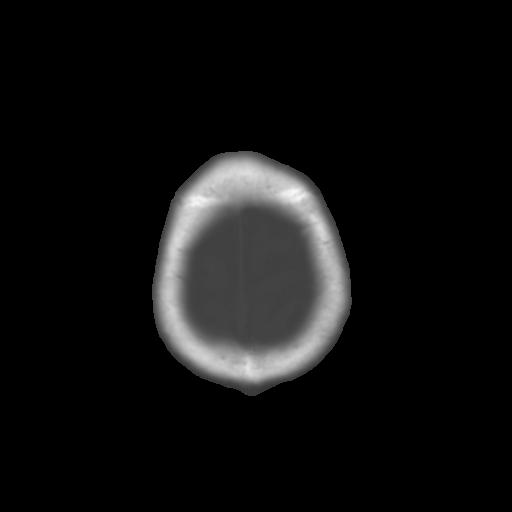
[im 30/33  brain]
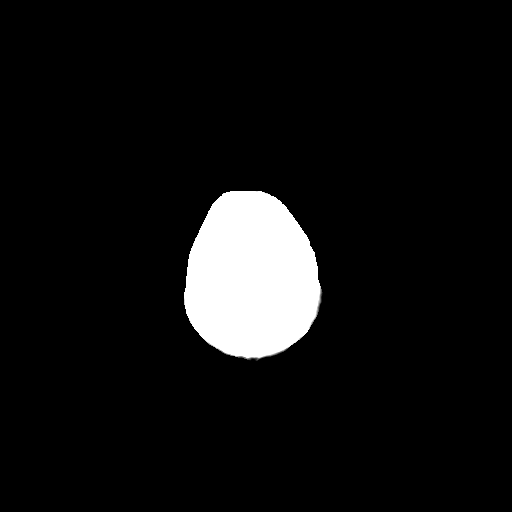

[Series 203: coronal st, idose (1) · coronal · 0.40mm/px · 3 of 79 slices shown]
[im 27/79  brain]
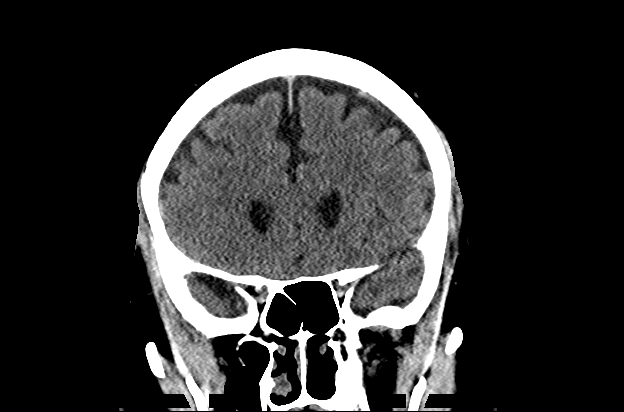
[im 35/79  brain]
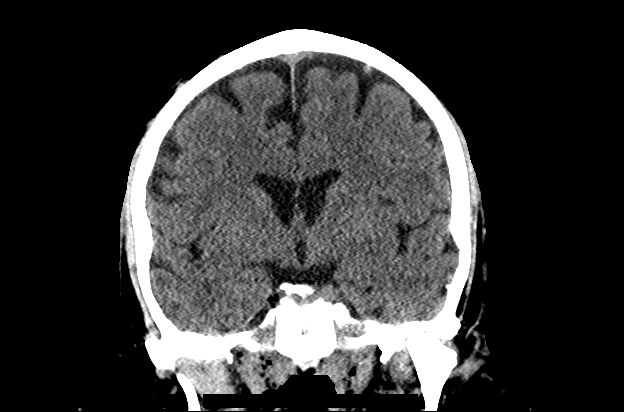
[im 44/79  brain]
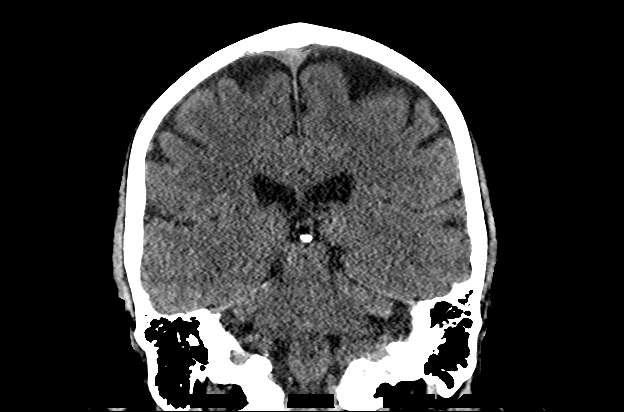

[Series 204: sagittal st, idose (1) · sagittal · 0.40mm/px · 3 of 83 slices shown]
[im 28/83  brain]
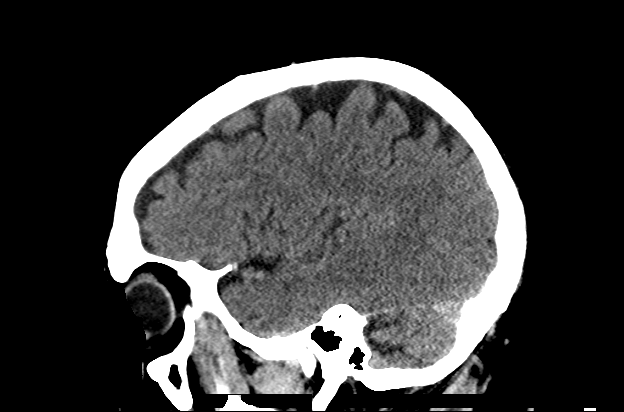
[im 42/83  brain]
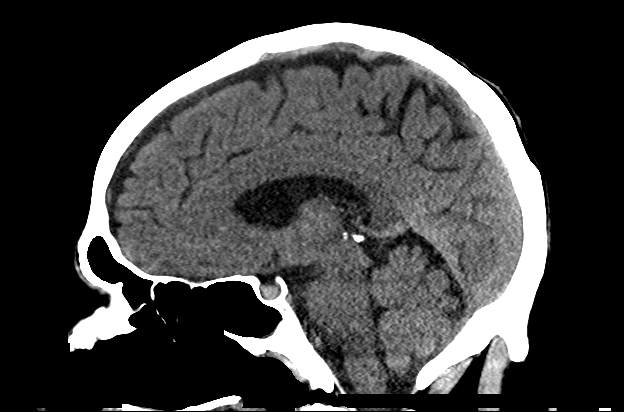
[im 55/83  brain]
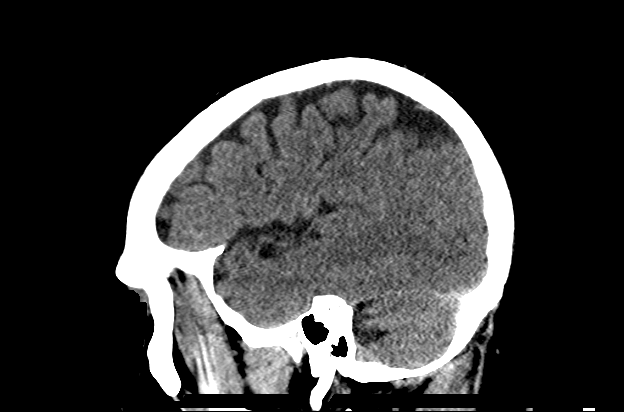

[16 of 47 positions shown; findings below may reference images not displayed]

FINDINGS: Brain: No acute territorial infarction, intracranial hemorrhage, or
extra-axial fluid collection is seen. No focal mass, mass effect or
midline shift. Questionable low-attenuation focus within left
frontal subcortical white matter, series 201, image number 19. Mild
atrophy. Stable ventricle size

Vascular: No hyperdense vessels.  No unexpected calcifications

Skull: Minimal fluid in the inferior mastoids. No skull fracture. No
suspicious lesion

Sinuses/Orbits: Mild mucosal thickening in the ethmoid sinuses. No
acute orbital abnormality.

Other: None
IMPRESSION: 1. Negative for acute hemorrhage or large territorial infarct
2. Possible subtle low-attenuation focus in the left frontal lobe
white matter ; if CVA is suspected, correlation with MRI could be
obtained.

## 2018-10-13 ENCOUNTER — Other Ambulatory Visit: Payer: Self-pay | Admitting: Medical

## 2018-10-16 NOTE — Telephone Encounter (Signed)
Per 3/19 phone call, pt has a new pcp and only requested 30 days of med until able to see pcp

## 2019-06-21 ENCOUNTER — Other Ambulatory Visit: Payer: Self-pay | Admitting: Medical

## 2019-12-18 ENCOUNTER — Emergency Department (HOSPITAL_COMMUNITY)
Admission: EM | Admit: 2019-12-18 | Discharge: 2019-12-18 | Discharge status: Against Medical Advice | Payer: BLUE CROSS/BLUE SHIELD | Attending: Emergency Medicine | Admitting: Emergency Medicine

## 2019-12-18 ENCOUNTER — Encounter (HOSPITAL_COMMUNITY): Payer: Self-pay

## 2019-12-18 ENCOUNTER — Other Ambulatory Visit: Payer: Self-pay

## 2019-12-18 DIAGNOSIS — M549 Dorsalgia, unspecified: Secondary | ICD-10-CM | POA: Insufficient documentation

## 2019-12-18 DIAGNOSIS — Z5321 Procedure and treatment not carried out due to patient leaving prior to being seen by health care provider: Secondary | ICD-10-CM | POA: Insufficient documentation

## 2019-12-18 NOTE — ED Triage Notes (Signed)
Patient arrived with complaints of back pain since Sunday, patient has a history of disc surgery in the past. Reports this morning he had difficulty moving around. Reports taking Ibuprofen with little relief.
